# Patient Record
Sex: Male | Born: 1935 | Race: White | Hispanic: No | Smoking: Former smoker
Health system: Southern US, Community
[De-identification: ages and names within clinical notes are randomized; demographics above are authoritative.]

## PROBLEM LIST (undated history)

## (undated) ENCOUNTER — Emergency Department (HOSPITAL_BASED_OUTPATIENT_CLINIC_OR_DEPARTMENT_OTHER): Admission: EM | Payer: PPO | Source: Home / Self Care

## (undated) DIAGNOSIS — Z87898 Personal history of other specified conditions: Secondary | ICD-10-CM

## (undated) DIAGNOSIS — M1711 Unilateral primary osteoarthritis, right knee: Secondary | ICD-10-CM

## (undated) DIAGNOSIS — M1712 Unilateral primary osteoarthritis, left knee: Secondary | ICD-10-CM

## (undated) DIAGNOSIS — I10 Essential (primary) hypertension: Secondary | ICD-10-CM

## (undated) DIAGNOSIS — E785 Hyperlipidemia, unspecified: Secondary | ICD-10-CM

## (undated) HISTORY — DX: Hyperlipidemia, unspecified: E78.5

## (undated) HISTORY — DX: Unilateral primary osteoarthritis, left knee: M17.12

## (undated) HISTORY — DX: Personal history of other specified conditions: Z87.898

## (undated) HISTORY — DX: Unilateral primary osteoarthritis, right knee: M17.11

## (undated) HISTORY — PX: LUMBAR DISC SURGERY: SHX700

## (undated) HISTORY — PX: APPENDECTOMY: SHX54

## (undated) HISTORY — PX: SKIN BIOPSY: SHX1

## (undated) HISTORY — DX: Essential (primary) hypertension: I10

---

## 2006-01-05 ENCOUNTER — Ambulatory Visit: Payer: Self-pay | Admitting: Family Medicine

## 2006-01-05 LAB — CONVERTED CEMR LAB
AST: 18 units/L (ref 0–37)
CO2: 32 meq/L (ref 19–32)
Chloride: 104 meq/L (ref 96–112)
Creatinine, Ser: 0.7 mg/dL (ref 0.4–1.5)
HDL: 43.6 mg/dL (ref >39.0)
Potassium: 4.2 meq/L (ref 3.5–5.1)
Sodium: 142 meq/L (ref 135–145)
Triglyceride fasting, serum: 115 mg/dL (ref 0–149)

## 2006-02-25 ENCOUNTER — Ambulatory Visit: Payer: Self-pay | Admitting: Gastroenterology

## 2006-04-08 ENCOUNTER — Ambulatory Visit: Payer: Self-pay | Admitting: Family Medicine

## 2006-05-06 ENCOUNTER — Encounter (INDEPENDENT_AMBULATORY_CARE_PROVIDER_SITE_OTHER): Payer: Self-pay | Admitting: Specialist

## 2006-05-06 ENCOUNTER — Ambulatory Visit: Payer: Self-pay | Admitting: Gastroenterology

## 2006-07-07 DIAGNOSIS — F528 Other sexual dysfunction not due to a substance or known physiological condition: Secondary | ICD-10-CM | POA: Insufficient documentation

## 2006-07-07 DIAGNOSIS — E785 Hyperlipidemia, unspecified: Secondary | ICD-10-CM

## 2006-07-07 DIAGNOSIS — I10 Essential (primary) hypertension: Secondary | ICD-10-CM | POA: Insufficient documentation

## 2006-07-07 DIAGNOSIS — G4700 Insomnia, unspecified: Secondary | ICD-10-CM | POA: Insufficient documentation

## 2006-07-07 DIAGNOSIS — M549 Dorsalgia, unspecified: Secondary | ICD-10-CM | POA: Insufficient documentation

## 2006-07-08 ENCOUNTER — Encounter: Payer: Self-pay | Admitting: Family Medicine

## 2006-07-08 ENCOUNTER — Encounter (INDEPENDENT_AMBULATORY_CARE_PROVIDER_SITE_OTHER): Payer: Self-pay | Admitting: Family Medicine

## 2006-07-08 ENCOUNTER — Ambulatory Visit: Payer: Self-pay | Admitting: Family Medicine

## 2006-07-19 ENCOUNTER — Telehealth (INDEPENDENT_AMBULATORY_CARE_PROVIDER_SITE_OTHER): Payer: Self-pay | Admitting: *Deleted

## 2006-07-25 ENCOUNTER — Telehealth (INDEPENDENT_AMBULATORY_CARE_PROVIDER_SITE_OTHER): Payer: Self-pay | Admitting: *Deleted

## 2006-07-26 ENCOUNTER — Telehealth (INDEPENDENT_AMBULATORY_CARE_PROVIDER_SITE_OTHER): Payer: Self-pay | Admitting: *Deleted

## 2006-07-26 LAB — CONVERTED CEMR LAB
AST: 16 units/L (ref 0–37)
BUN: 14 mg/dL (ref 6–23)
CO2: 33 meq/L — ABNORMAL HIGH (ref 19–32)
GFR calc Af Amer: 171 mL/min
HDL: 48.8 mg/dL (ref >39.0)
Potassium: 4.2 meq/L (ref 3.5–5.1)
Sodium: 139 meq/L (ref 135–145)
Total CHOL/HDL Ratio: 3.7
Triglycerides: 114 mg/dL (ref 0–149)
VLDL: 23 mg/dL (ref 0–40)

## 2006-08-01 ENCOUNTER — Telehealth (INDEPENDENT_AMBULATORY_CARE_PROVIDER_SITE_OTHER): Payer: Self-pay | Admitting: *Deleted

## 2006-10-07 ENCOUNTER — Ambulatory Visit: Payer: Self-pay | Admitting: Family Medicine

## 2006-10-10 ENCOUNTER — Encounter (INDEPENDENT_AMBULATORY_CARE_PROVIDER_SITE_OTHER): Payer: Self-pay | Admitting: *Deleted

## 2006-10-10 LAB — CONVERTED CEMR LAB
CO2: 34 meq/L — ABNORMAL HIGH (ref 19–32)
Calcium: 9.4 mg/dL (ref 8.4–10.5)
Chloride: 106 meq/L (ref 96–112)
Creatinine, Ser: 0.7 mg/dL (ref 0.4–1.5)
GFR calc non Af Amer: 118 mL/min
Glucose, Bld: 92 mg/dL (ref 70–99)
Sodium: 144 meq/L (ref 135–145)

## 2007-01-13 ENCOUNTER — Ambulatory Visit: Payer: Self-pay | Admitting: Family Medicine

## 2007-01-17 LAB — CONVERTED CEMR LAB
ALT: 15 units/L (ref 0–53)
AST: 15 units/L (ref 0–37)
BUN: 13 mg/dL (ref 6–23)
Calcium: 9.4 mg/dL (ref 8.4–10.5)
GFR calc Af Amer: 171 mL/min
GFR calc non Af Amer: 141 mL/min
LDL Cholesterol: 95 mg/dL (ref 0–99)
Potassium: 3.8 meq/L (ref 3.5–5.1)
Total CHOL/HDL Ratio: 3.4
Triglycerides: 75 mg/dL (ref 0–149)
VLDL: 15 mg/dL (ref 0–40)

## 2007-01-18 ENCOUNTER — Encounter (INDEPENDENT_AMBULATORY_CARE_PROVIDER_SITE_OTHER): Payer: Self-pay | Admitting: *Deleted

## 2007-01-18 ENCOUNTER — Telehealth (INDEPENDENT_AMBULATORY_CARE_PROVIDER_SITE_OTHER): Payer: Self-pay | Admitting: *Deleted

## 2007-03-06 ENCOUNTER — Telehealth (INDEPENDENT_AMBULATORY_CARE_PROVIDER_SITE_OTHER): Payer: Self-pay | Admitting: *Deleted

## 2007-03-10 ENCOUNTER — Ambulatory Visit: Payer: Self-pay | Admitting: Family Medicine

## 2007-04-10 ENCOUNTER — Telehealth (INDEPENDENT_AMBULATORY_CARE_PROVIDER_SITE_OTHER): Payer: Self-pay | Admitting: *Deleted

## 2007-07-05 ENCOUNTER — Telehealth (INDEPENDENT_AMBULATORY_CARE_PROVIDER_SITE_OTHER): Payer: Self-pay | Admitting: *Deleted

## 2007-07-26 ENCOUNTER — Encounter: Payer: Self-pay | Admitting: Family Medicine

## 2007-07-26 ENCOUNTER — Ambulatory Visit: Payer: Self-pay | Admitting: Internal Medicine

## 2007-07-27 ENCOUNTER — Encounter (INDEPENDENT_AMBULATORY_CARE_PROVIDER_SITE_OTHER): Payer: Self-pay | Admitting: *Deleted

## 2007-07-28 ENCOUNTER — Telehealth (INDEPENDENT_AMBULATORY_CARE_PROVIDER_SITE_OTHER): Payer: Self-pay | Admitting: *Deleted

## 2007-07-28 LAB — CONVERTED CEMR LAB
BUN: 11 mg/dL (ref 6–23)
Chloride: 102 meq/L (ref 96–112)
Eosinophils Relative: 4.1 % (ref 0.0–5.0)
GFR calc Af Amer: 143 mL/min
GFR calc non Af Amer: 118 mL/min
Glucose, Bld: 95 mg/dL (ref 70–99)
HCT: 44.5 % (ref 39.0–52.0)
Monocytes Absolute: 0.9 10*3/uL (ref 0.1–1.0)
Monocytes Relative: 9.7 % (ref 3.0–12.0)
Neutrophils Relative %: 75.7 % (ref 43.0–77.0)
Platelets: 181 10*3/uL (ref 150–400)
Potassium: 3.8 meq/L (ref 3.5–5.1)
RDW: 14.4 % (ref 11.5–14.6)
Sodium: 140 meq/L (ref 135–145)
TSH: 1.05 microintl units/mL (ref 0.35–5.50)
WBC: 9 10*3/uL (ref 4.5–10.5)

## 2007-07-31 ENCOUNTER — Encounter (INDEPENDENT_AMBULATORY_CARE_PROVIDER_SITE_OTHER): Payer: Self-pay | Admitting: *Deleted

## 2007-08-07 ENCOUNTER — Telehealth (INDEPENDENT_AMBULATORY_CARE_PROVIDER_SITE_OTHER): Payer: Self-pay | Admitting: *Deleted

## 2007-08-10 ENCOUNTER — Telehealth: Payer: Self-pay | Admitting: Internal Medicine

## 2007-08-14 ENCOUNTER — Telehealth: Payer: Self-pay | Admitting: Internal Medicine

## 2007-10-19 ENCOUNTER — Ambulatory Visit: Payer: Self-pay | Admitting: Internal Medicine

## 2007-11-10 ENCOUNTER — Encounter (INDEPENDENT_AMBULATORY_CARE_PROVIDER_SITE_OTHER): Payer: Self-pay | Admitting: *Deleted

## 2007-11-29 ENCOUNTER — Telehealth (INDEPENDENT_AMBULATORY_CARE_PROVIDER_SITE_OTHER): Payer: Self-pay | Admitting: *Deleted

## 2007-12-13 ENCOUNTER — Ambulatory Visit: Payer: Self-pay | Admitting: Internal Medicine

## 2008-01-01 ENCOUNTER — Telehealth (INDEPENDENT_AMBULATORY_CARE_PROVIDER_SITE_OTHER): Payer: Self-pay | Admitting: *Deleted

## 2008-04-04 ENCOUNTER — Telehealth (INDEPENDENT_AMBULATORY_CARE_PROVIDER_SITE_OTHER): Payer: Self-pay | Admitting: *Deleted

## 2008-04-15 ENCOUNTER — Ambulatory Visit: Payer: Self-pay | Admitting: Internal Medicine

## 2008-04-15 DIAGNOSIS — Z860100 Personal history of colon polyps, unspecified: Secondary | ICD-10-CM | POA: Insufficient documentation

## 2008-04-15 LAB — CONVERTED CEMR LAB
Cholesterol, target level: 200 mg/dL
LDL Goal: 130 mg/dL

## 2008-04-19 ENCOUNTER — Ambulatory Visit: Payer: Self-pay | Admitting: Internal Medicine

## 2008-04-29 LAB — CONVERTED CEMR LAB
ALT: 14 units/L (ref 0–53)
AST: 17 units/L (ref 0–37)
Albumin: 3.8 g/dL (ref 3.5–5.2)
Basophils Relative: 0.9 % (ref 0.0–3.0)
Cholesterol: 155 mg/dL (ref 0–200)
Eosinophils Relative: 5.5 % — ABNORMAL HIGH (ref 0.0–5.0)
HCT: 44.1 % (ref 39.0–52.0)
Hemoglobin: 15.1 g/dL (ref 13.0–17.0)
Hgb A1c MFr Bld: 6 % (ref 4.6–6.0)
LDL Cholesterol: 95 mg/dL (ref 0–99)
Lymphocytes Relative: 17.1 % (ref 12.0–46.0)
Monocytes Absolute: 0.7 10*3/uL (ref 0.1–1.0)
Monocytes Relative: 9.7 % (ref 3.0–12.0)
Neutro Abs: 5.2 10*3/uL (ref 1.4–7.7)
PSA: 2.37 ng/mL (ref 0.10–4.00)
RBC: 5.44 M/uL (ref 4.22–5.81)
Total CHOL/HDL Ratio: 3.1
Total Protein: 6.5 g/dL (ref 6.0–8.3)

## 2008-04-30 ENCOUNTER — Encounter (INDEPENDENT_AMBULATORY_CARE_PROVIDER_SITE_OTHER): Payer: Self-pay | Admitting: *Deleted

## 2008-07-08 ENCOUNTER — Telehealth (INDEPENDENT_AMBULATORY_CARE_PROVIDER_SITE_OTHER): Payer: Self-pay | Admitting: *Deleted

## 2008-09-27 ENCOUNTER — Telehealth (INDEPENDENT_AMBULATORY_CARE_PROVIDER_SITE_OTHER): Payer: Self-pay | Admitting: *Deleted

## 2008-10-24 ENCOUNTER — Ambulatory Visit: Payer: Self-pay | Admitting: Internal Medicine

## 2008-10-29 ENCOUNTER — Encounter (INDEPENDENT_AMBULATORY_CARE_PROVIDER_SITE_OTHER): Payer: Self-pay | Admitting: *Deleted

## 2008-12-26 ENCOUNTER — Telehealth (INDEPENDENT_AMBULATORY_CARE_PROVIDER_SITE_OTHER): Payer: Self-pay | Admitting: *Deleted

## 2009-02-05 ENCOUNTER — Ambulatory Visit: Payer: Self-pay | Admitting: Internal Medicine

## 2009-02-18 ENCOUNTER — Encounter: Payer: Self-pay | Admitting: Internal Medicine

## 2009-04-15 ENCOUNTER — Encounter (INDEPENDENT_AMBULATORY_CARE_PROVIDER_SITE_OTHER): Payer: Self-pay | Admitting: *Deleted

## 2009-04-28 ENCOUNTER — Ambulatory Visit: Payer: Self-pay | Admitting: Internal Medicine

## 2009-04-28 ENCOUNTER — Telehealth (INDEPENDENT_AMBULATORY_CARE_PROVIDER_SITE_OTHER): Payer: Self-pay | Admitting: *Deleted

## 2009-04-28 LAB — CONVERTED CEMR LAB
Cholesterol: 150 mg/dL (ref 0–200)
HDL: 57.7 mg/dL (ref >39.00)
LDL Cholesterol: 76 mg/dL (ref 0–99)
Triglycerides: 84 mg/dL (ref 0.0–149.0)
VLDL: 16.8 mg/dL (ref 0.0–40.0)

## 2009-05-07 ENCOUNTER — Ambulatory Visit: Payer: Self-pay | Admitting: Internal Medicine

## 2009-05-07 ENCOUNTER — Encounter (INDEPENDENT_AMBULATORY_CARE_PROVIDER_SITE_OTHER): Payer: Self-pay | Admitting: *Deleted

## 2009-05-07 DIAGNOSIS — L570 Actinic keratosis: Secondary | ICD-10-CM | POA: Insufficient documentation

## 2009-05-08 LAB — CONVERTED CEMR LAB
AST: 16 units/L (ref 0–37)
BUN: 11 mg/dL (ref 6–23)

## 2009-05-19 ENCOUNTER — Encounter: Payer: Self-pay | Admitting: Internal Medicine

## 2009-06-30 ENCOUNTER — Telehealth (INDEPENDENT_AMBULATORY_CARE_PROVIDER_SITE_OTHER): Payer: Self-pay | Admitting: *Deleted

## 2009-08-26 ENCOUNTER — Telehealth: Payer: Self-pay | Admitting: Internal Medicine

## 2009-09-02 ENCOUNTER — Telehealth: Payer: Self-pay | Admitting: Internal Medicine

## 2009-12-02 ENCOUNTER — Telehealth: Payer: Self-pay | Admitting: Gastroenterology

## 2009-12-12 ENCOUNTER — Encounter: Payer: Self-pay | Admitting: Internal Medicine

## 2009-12-23 ENCOUNTER — Telehealth (INDEPENDENT_AMBULATORY_CARE_PROVIDER_SITE_OTHER): Payer: Self-pay | Admitting: *Deleted

## 2010-03-11 ENCOUNTER — Telehealth (INDEPENDENT_AMBULATORY_CARE_PROVIDER_SITE_OTHER): Payer: Self-pay | Admitting: *Deleted

## 2010-03-14 ENCOUNTER — Encounter: Payer: Self-pay | Admitting: Family Medicine

## 2010-03-26 NOTE — Medication Information (Signed)
Summary: Nonadherence with Statin/[employer]  Nonadherence with Statin/[employer]   Imported By: Lanelle Bal 01/06/2010 11:53:36  _____________________________________________________________________  External Attachment:    Type:   Image     Comment:   External Document

## 2010-03-26 NOTE — Letter (Signed)
Summary: Colonoscopy Letter  [facility]  [address] [place], [place] [address]   Phone: [phone]  Fax: [phone]      April 15, 2009 MRN: [id number]   Wayne Nash [address] [place], [place]  [address]   Dear Mr. Makar,   According to your medical record, it is time for you to schedule a Colonoscopy. The American Cancer Society recommends this procedure as a method to detect early colon cancer. Patients with a family history of colon cancer, or a personal history of colon polyps or inflammatory bowel disease are at increased risk.  This letter has beeen generated based on the recommendations made at the time of your procedure. If you feel that in your particular situation this may no longer apply, please contact our office.  Please call our office at [phone] to schedule this appointment or to update your records at your earliest convenience.  Thank you for cooperating with [place] to provide you with the very best care possible.   Sincerely,  Barbette Hair. Arlyce Dice, M.D.  [facility] Gastroenterology Division [phone]

## 2010-03-26 NOTE — Consult Note (Signed)
Summary: [facility]  [facility]   Imported By: Lanelle Bal 03/06/2009 08:57:45  _____________________________________________________________________  External Attachment:    Type:   Image     Comment:   External Document

## 2010-03-26 NOTE — Progress Notes (Signed)
Summary: lipitor refill--pt is almost out  Phone Note Refill Request Call back at [phone] Message from:  Patient on March 11, 2010 4:25 PM  Refills Requested: Medication #1:  LIPITOR 20 MG TABS Take one tablet daily. [facility]--he is almost out  Next Appointment Scheduled: none Initial call taken by: Jerolyn Shin,  March 11, 2010 4:26 PM    New/Updated Medications: LIPITOR 20 MG TABS (ATORVASTATIN CALCIUM) Take one tablet daily **LABS DUE 04/2010** Prescriptions: LIPITOR 20 MG TABS (ATORVASTATIN CALCIUM) Take one tablet daily **LABS DUE 04/2010**  #90 x 0   Entered by:   Shonna Chock CMA   Authorized by:   Marga Melnick MD   Signed by:   Shonna Chock CMA on 03/11/2010   Method used:   Faxed to ...       [facility] (mail-[facility])             , [place]         Ph: [phone]       Fax: [phone]   RxID:   [id number]

## 2010-03-26 NOTE — Consult Note (Signed)
Summary: Duard Larsen MD Dermatology  Duard Larsen MD Dermatology   Imported By: Lanelle Bal 07/09/2009 10:04:44  _____________________________________________________________________  External Attachment:    Type:   Image     Comment:   External Document

## 2010-03-26 NOTE — Letter (Signed)
Summary: Primary Care Consult Scheduled Letter  [facility] [place]  [address] [place], [place] [address]   Phone: [phone]  Fax: [phone]      05/07/2009 MRN: [id number]  DIONTRE HARPS [address] [place], [place]  [address]    Dear Mr. Sundra Aland,    We have scheduled an appointment for you.  At the recommendation of Dr. Marga Melnick, we have scheduled you a consult with Dr. June Leap of [facility] on 05-19-2009 at 11:30am.  Their address is [address], [place] [address]. The office phone number is [phone].  If this appointment day and time is not convenient for you, please feel free to call the office of the doctor you are being referred to at the number listed above and reschedule the appointment.    It is important for you to keep your scheduled appointments. We are here to make sure you are given good patient care.   Thank you,    Renee, Patient Care Coordinator [place] [facility]

## 2010-03-26 NOTE — Progress Notes (Signed)
Summary: Refill Request  Phone Note Refill Request Message from:  Patient on Jun 30, 2009 9:13 AM  Refills Requested: Medication #1:  BENAZEPRIL-HYDROCHLOROTHIAZIDE 10-12.5 MG TABS Take one tablet daily   Dosage confirmed as above?Dosage Confirmed   Supply Requested: 3 months [facility]  Next Appointment Scheduled: none Initial call taken by: Harold Barban,  Jun 30, 2009 9:13 AM    Prescriptions: BENAZEPRIL-HYDROCHLOROTHIAZIDE 10-12.5 MG TABS (BENAZEPRIL-HYDROCHLOROTHIAZIDE) Take one tablet daily  #90 x 1   Entered by:   Shonna Chock   Authorized by:   Marga Melnick MD   Signed by:   Shonna Chock on 06/30/2009   Method used:   Faxed to ...       [facility] (mail-order)             ,          Ph: [phone]       Fax: [phone]   RxID:   [id number]

## 2010-03-26 NOTE — Progress Notes (Signed)
Summary: needs 2 meds from local pharmacy  Phone Note Refill Request Message from:  Patient on December 23, 2009 3:30 PM  Refills Requested: Medication #1:  BENAZEPRIL-HYDROCHLOROTHIAZIDE 10-12.5 MG TABS Take one tablet daily  Medication #2:  LIPITOR 20 MG TABS Take one tablet daily. patient says he needs 15 pills of each until medication from [facility] is received--[facility]  Initial call taken by: Jerolyn Shin,  December 23, 2009 5:05 PM  Follow-up for Phone Call         I sent rx's in for #30, pharmacy usually charges the same amount for 15 or 30 day supply Follow-up by: Shonna Chock CMA,  December 24, 2009 11:20 AM    Prescriptions: LIPITOR 20 MG TABS (ATORVASTATIN CALCIUM) Take one tablet daily.  #30 x 0   Entered by:   Shonna Chock CMA   Authorized by:   Marga Melnick MD   Signed by:   Shonna Chock CMA on 12/24/2009   Method used:   Electronically to        [facility] [phone]* (retail)       [address]       [place], [place]  [address]       Ph: [phone]       Fax: [phone]   RxID:   [id number] BENAZEPRIL-HYDROCHLOROTHIAZIDE 10-12.5 MG TABS (BENAZEPRIL-HYDROCHLOROTHIAZIDE) Take one tablet daily  #30 x 0   Entered by:   Shonna Chock CMA   Authorized by:   Marga Melnick MD   Signed by:   Shonna Chock CMA on 12/24/2009   Method used:   Electronically to        [facility] [phone]* (retail)       [address]       [place], [place]  [address]       Ph: [phone]       Fax: [phone]   RxID:   [id number]

## 2010-03-26 NOTE — Progress Notes (Signed)
Summary: Refill--Lipitor  Phone Note Refill Request Message from:  Patient on September 02, 2009 9:16 AM  Refills Requested: Medication #1:  LIPITOR 20 MG TABS Take one tablet daily. I spoke with patient and he notes that he is dangerously low of the above med and only has one refill of his BP med. Patient aware we will fax prescription.  Initial call taken by: Lucious Groves,  September 02, 2009 9:16 AM    Prescriptions: LIPITOR 20 MG TABS (ATORVASTATIN CALCIUM) Take one tablet daily.  #90 x 1   Entered by:   Lucious Groves   Authorized by:   Marga Melnick MD   Signed by:   Lucious Groves on 09/02/2009   Method used:   Faxed to ...       [facility] (mail-order)             , [place]         Ph: [phone]       Fax: [phone]   RxID:   [id number]

## 2010-03-26 NOTE — Letter (Signed)
Summary: Results Follow-up Letter  [place] [facility]  [address] [place], [place] [address]   Phone: [phone]  Fax: [phone]    07/31/2007        Kimberlee Nearing. Kempker [address] [place], [place]  [address]  Dear Mr. Exley,   The following are the results of your recent test(s):  Test     Result     Pap Smear    Normal_______  Not Normal_____       Comments: _________________________________________________________ Cholesterol LDL(Bad cholesterol):          Your goal is less than:         HDL (Good cholesterol):        Your goal is more than: _________________________________________________________ Other Tests:   _________________________________________________________  Please call for an appointment Or __Please see attached._______________________________________________________ _________________________________________________________ _________________________________________________________  Sincerely,  Ardyth Man [place] [facility]

## 2010-03-26 NOTE — Progress Notes (Signed)
Summary: Schedule Colonoscopy   Phone Note Outgoing Call Call back at Home Phone [phone]   Call placed by: Harlow Mares CMA Duncan Dull),  December 02, 2009 4:04 PM Call placed to: Patient Summary of Call: No Answer, patient is due for a recall colonoscopy. Initial call taken by: Harlow Mares CMA Duncan Dull),  December 02, 2009 4:04 PM  Follow-up for Phone Call        No Answer, we will mail the patient a letter to remind  him he is due for a colonoscopy. Follow-up by: Harlow Mares CMA Duncan Dull),  December 05, 2009 4:20 PM

## 2010-03-26 NOTE — Progress Notes (Signed)
Summary: insect bite?   Phone Note Refill Request Message from:  [facility] on August 26, 2009 1:36 PM  Received refill request from pharmacy for Prednisone. Pt states he was prescribed this a couple of years ago when he had an insect bite. States that something bit him through the night a couple of days ago and had a red, swollen place on his arm.  He states he took the last 2 Prednisone pills and the swelling/redness went away.  Advised pt. he needed to be seen and pt. states he doesn't need to be seen now that the swelling is gone, just wants refill to have on hand.  Please advise.  Nicki Guadalajara Fergerson CMA Duncan Dull)  August 26, 2009 1:39 PM    Follow-up for Phone Call        Prednisone shiould be taken as infrequently as possible due to adverse effects & risks ( mainly compromised immune status , bone thinning & diabetes) . If he has new symptoms he feels warrant steroids , he should call. I'll assess it based on symptoms & signs. I'm just trying to practice good medicine for him Follow-up by: Marga Melnick MD,  August 26, 2009 5:16 PM  Additional Follow-up for Phone Call Additional follow up Details #1::        Home number has no answering machine. 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 I asked him to give the pt a message to call drs office. Additional Follow-up by: Josph Macho RMA,  August 27, 2009 11:14 AM    Additional Follow-up for Phone Call Additional follow up Details #2::    Home number not working, called work number and he is not in the office at this time asked to give him a message. Army Fossa CMA  August 28, 2009 3:07 PM   Pt did not return messages that were left for him. Army Fossa CMA  September 01, 2009 4:38 PM   Additional Follow-up for Phone Call Additional follow up Details #3:: Details for Additional Follow-up Action Taken: Topical steroids such as Cort Aid two times a day as needed would be most appropriate therapy;  oral steroids would have excessive risks compared to any short term  benefit Additional Follow-up by: Marga Melnick MD,  September 02, 2009 8:20 AM     Attempted to reach patient at home number, no answer/no machine. Called work number, left message with "Thayer Ohm" to have the patient call the office. Lucious Groves  September 02, 2009 8:37 AM   Patient notified [phone]) and notes that he has taken Benadryl. [place] 2 or 3 times. Patient was requesting to have Prednisone to keep at home. Patient advised to get the above cream and keep it at home for these episodes and to call back if it does not help. Patient expressed understanding. Lucious Groves  September 02, 2009 9:16 AM

## 2010-03-26 NOTE — Progress Notes (Signed)
Summary: refill  Phone Note Refill Request Message from:  Fax from Pharmacy on April 28, 2009 9:03 AM  Refills Requested: Medication #1:  HYDROCODONE-ACETAMINOPHEN 5-500 MG TABS 1 by mouth  by mouth 6 hr as needed [facility] [phone]   Method Requested: Fax to Local Pharmacy Next Appointment Scheduled: no appt Initial call taken by: Barb Merino,  April 28, 2009 9:04 AM    Prescriptions: HYDROCODONE-ACETAMINOPHEN 5-500 MG TABS (HYDROCODONE-ACETAMINOPHEN) 1 by mouth  by mouth 6 hr as needed  #50 x 0   Entered by:   Shonna Chock   Authorized by:   Marga Melnick MD   Signed by:   Shonna Chock on 04/29/2009   Method used:   Printed then faxed to ...       [facility] [phone]* (retail)       [address]       [place], [place]  [address]       Ph: [phone]       Fax: [phone]   RxID:   [id number]

## 2010-03-26 NOTE — Progress Notes (Signed)
°  Phone Note Outgoing Call   Call placed by: Ardyth Man,  Jul 19, 2006 4:25 PM Request: Send information Summary of Call: Archie Endo and [facility] for copy of PSA and also faxed request to their lab.  Follow-up for Phone Call        Phone call completed Follow-up by: Ardyth Man,  July 25, 2006 3:14 PM

## 2010-03-26 NOTE — Assessment & Plan Note (Signed)
Summary: bp check//lch   Nurse Visit   Vital Signs:  Patient profile:   77 year old male Weight:      238.4 pounds Pulse rate:   72 / minute Resp:     16 per minute BP sitting:   126 / 60  (left arm) Cuff size:   large  Vitals Entered By: Shonna Chock (May 07, 2009 8:36 AM)  CC:  1.) Discuss B/P  2.) Follow-up on labs (copy given)  3.) Refill Zolpidem and Viagra, Hypertension Management, and Lipid Management.  History of Present Illness: Labs reviewed & risks discussed. Lipids & A1c are @ goal & stable over 12 months. Walking @work ; no specific diet.He is averages 6 hydrocodone / week, Zolpidem 4X/week, & 5 Doxepin / month. Rarely will he take Chlozoxazone. Risk of taking these closely together discussed.  Hypertension History:      He denies headache, chest pain, palpitations, dyspnea with exertion, orthopnea, PND, peripheral edema, visual symptoms, neurologic problems, syncope, and side effects from treatment.  He notes no problems with any antihypertensive medication side effects.  Not checking BP regularly ,but ? high @ drugstore.        Positive major cardiovascular risk factors include male age 60 years old or older, hyperlipidemia, and hypertension.  Negative major cardiovascular risk factors include no history of diabetes, negative family history for ischemic heart disease, and non-tobacco-user status.        Further assessment for target organ damage reveals no history of ASHD, stroke/TIA, or peripheral vascular disease.    Lipid Management History:      Positive NCEP/ATP III risk factors include male age 19 years old or older and hypertension.  Negative NCEP/ATP III risk factors include non-diabetic, no family history for ischemic heart disease, non-tobacco-user status, no ASHD (atherosclerotic heart disease), no prior stroke/TIA, no peripheral vascular disease, and no history of aortic aneurysm.      Past History:  Past Medical History: Hypertension ED; DDD LS  spine Colonic polyps, PMH  of Hyperlipidemia  Past Surgical History: Lumbar laminectomy , Dr Simonne Come Appendectomy Colon polypectomy, PMH of. He has received recall letter in 03/2009   Review of Systems General:  Complains of sleep disorder; denies fatigue and weight loss. Eyes:  Denies blurring, double vision, and vision loss-both eyes. ENT:  Denies difficulty swallowing and hoarseness. CV:  Denies leg cramps with exertion and lightheadness. Resp:  Denies cough, excessive snoring, hypersomnolence, morning headaches, shortness of breath, and sputum productive. GI:  Denies abdominal pain, bloody stools, dark tarry stools, and indigestion. MS:  Complains of low back pain. Derm:  Denies changes in nail beds, dryness, hair loss, and poor wound healing. Neuro:  Denies numbness and tingling. Psych:  Denies anxiety and depression. Endo:  Denies cold intolerance, excessive hunger, excessive thirst, excessive urination, and heat intolerance. Heme:  Denies abnormal bruising and bleeding.   Physical Exam  General:  well-nourished;appears younger than age; alert,appropriate and cooperative throughout examination Neck:  No deformities, masses, or tenderness noted. Lungs:  Normal respiratory effort, chest expands symmetrically. Lungs are clear to auscultation, no crackles or wheezes. Heart:  Normal rate and regular rhythm. S1 and S2 normal without gallop, murmur, click, rub or other extra sounds. Abdomen:  Bowel sounds positive,abdomen soft and non-tender without masses, organomegaly or hernias noted. Pulses:  R and L carotid,radial,dorsalis pedis and posterior tibial pulses are full and equal bilaterally Extremities:  No clubbing, cyanosis, edema, or deformity noted with normal full range of motion of all joints.  Neg SLR Neurologic:  alert & oriented X3, strength normal in all extremities, and DTRs symmetrical and normal except 0+ @ R knee.   Skin:  Solar keratoses of forearms Cervical  Nodes:  No lymphadenopathy noted Axillary Nodes:  No palpable lymphadenopathy Psych:  memory intact for recent and remote, normally interactive, and good eye contact.      Impression & Recommendations:  Problem # 1:  HYPERLIPIDEMIA (ICD-272.4)  Lipids @ goal His updated medication list for this problem includes:    Lipitor 20 Mg Tabs (Atorvastatin calcium) .Marland Kitchen... Take one tablet daily.  Orders: TLB-ALT (SGPT) (84460-ALT) TLB-AST (SGOT) (84450-SGOT)  Problem # 2:  HYPERTENSION (ICD-401.9)  controlled His updated medication list for this problem includes:    Benazepril-hydrochlorothiazide 10-12.5 Mg Tabs (Benazepril-hydrochlorothiazide) .Marland Kitchen... Take one tablet daily  Orders: Venipuncture ([address]) TLB-Creatinine, Blood (82565-CREA) TLB-Potassium (K+) (84132-K) TLB-BUN (Urea Nitrogen) (84520-BUN)  Problem # 3:  COLONIC POLYPS, HX OF (ICD-V12.72) F/U discussed ; requested to schedule with GI  Problem # 4:  SOLAR KERATOSIS (ICD-702.0)  Orders: Dermatology Referral (Derma)  Complete Medication List: 1)  Zolpidem Tartrate 10 Mg Tabs (Zolpidem tartrate) .Marland Kitchen.. 1 tablet by mouth at bedtime 2)  Doxepin Hcl 25 Mg Caps (Doxepin hcl) 3)  Benazepril-hydrochlorothiazide 10-12.5 Mg Tabs (Benazepril-hydrochlorothiazide) .... Take one tablet daily 4)  Viagra 100 Mg Tabs (Sildenafil citrate) 5)  Chlorzoxazone 500 Mg Tabs (Chlorzoxazone) 6)  Hydrocodone-acetaminophen 5-500 Mg Tabs (Hydrocodone-acetaminophen) .Marland Kitchen.. 1 by mouth  by mouth 6 hr as needed 7)  Lipitor 20 Mg Tabs (Atorvastatin calcium) .... Take one tablet daily. 8)  Cyclobenzaprine Hcl 5 Mg Tabs (Cyclobenzaprine hcl) .Marland Kitchen.. 1 two times a day as needed & 1-2 at bedtime as needed  Hypertension Assessment/Plan:      The patient's hypertensive risk group is category B: At least one risk factor (excluding diabetes) with no target organ damage.  His calculated 10 year risk of coronary heart disease is 9 %.  Today's blood pressure is 126/60.     Lipid Assessment/Plan:      Based on NCEP/ATP III, the patient's risk factor category is "2 or more risk factors and a calculated 10 year CAD risk of < 20%".  The patient's lipid goals are as follows: Total cholesterol goal is 200; LDL cholesterol goal is 130; HDL cholesterol goal is 40; Triglyceride goal is 150.  His LDL cholesterol goal has been met.     Patient Instructions: 1)  Schedule a colonoscopy  to help detect colon cancer as discussed.   Family History: Father:  DUD Mother: pancreatic cancer Siblings: none; no FH CAD  Social History: Occupation: Parts Delivery Divorced Former Smoker Alcohol use-no   CC: 1.) Discuss B/P  2.) Follow-up on labs (copy given)  3.) Refill Zolpidem and Viagra, Hypertension Management, Lipid Management  Does patient need assistance? Functional Status Self care Comments REVIEWED MED LIST, PATIENT AGREED DOSE AND INSTRUCTION CORRECT    Preventive Screening-Counseling & Management  Alcohol-Tobacco     Smoking Status: quit  Allergies: 1)  ! Pcn 2)  ! * Bee Stings  Orders Added: 1)  Venipuncture [36415] 2)  TLB-ALT (SGPT) [84460-ALT] 3)  TLB-AST (SGOT) [84450-SGOT] 4)  TLB-Creatinine, Blood [82565-CREA] 5)  TLB-Potassium (K+) [84132-K] 6)  TLB-BUN (Urea Nitrogen) [84520-BUN] 7)  Est. Patient Level IV [[address]] 8)  Dermatology Referral [Derma] Prescriptions: VIAGRA 100 MG TABS (SILDENAFIL CITRATE)   #6 x 5   Entered and Authorized by:   Marga Melnick MD   Signed by:   Chrissie Noa  Matthew Pais MD on 05/07/2009   Method used:   Print then Give to Patient   RxID:   [id number] ZOLPIDEM TARTRATE 10 MG TABS (ZOLPIDEM TARTRATE) 1 tablet by mouth at bedtime  #30 x 3   Entered and Authorized by:   Marga Melnick MD   Signed by:   Marga Melnick MD on 05/07/2009   Method used:   Print then Give to Patient   RxID:   [id number]

## 2010-04-21 ENCOUNTER — Telehealth (INDEPENDENT_AMBULATORY_CARE_PROVIDER_SITE_OTHER): Payer: Self-pay | Admitting: *Deleted

## 2010-04-30 ENCOUNTER — Encounter (INDEPENDENT_AMBULATORY_CARE_PROVIDER_SITE_OTHER): Payer: 59 | Admitting: Internal Medicine

## 2010-04-30 ENCOUNTER — Other Ambulatory Visit: Payer: Self-pay | Admitting: Internal Medicine

## 2010-04-30 ENCOUNTER — Encounter: Payer: Self-pay | Admitting: Internal Medicine

## 2010-04-30 DIAGNOSIS — I10 Essential (primary) hypertension: Secondary | ICD-10-CM

## 2010-04-30 DIAGNOSIS — E785 Hyperlipidemia, unspecified: Secondary | ICD-10-CM

## 2010-04-30 DIAGNOSIS — Z860100 Personal history of colon polyps, unspecified: Secondary | ICD-10-CM

## 2010-04-30 DIAGNOSIS — Z0000 Encounter for general adult medical examination without abnormal findings: Secondary | ICD-10-CM

## 2010-04-30 DIAGNOSIS — Z23 Encounter for immunization: Secondary | ICD-10-CM

## 2010-04-30 LAB — BASIC METABOLIC PANEL
BUN: 15 mg/dL (ref 6–23)
Creatinine, Ser: 0.5 mg/dL (ref 0.4–1.5)
GFR: 164.8 mL/min (ref >60.00)
Glucose, Bld: 99 mg/dL (ref 70–99)
Potassium: 4.3 mEq/L (ref 3.5–5.1)

## 2010-04-30 LAB — CBC WITH DIFFERENTIAL/PLATELET
Basophils Absolute: 0.1 10*3/uL (ref 0.0–0.1)
HCT: 44.4 % (ref 39.0–52.0)
Lymphs Abs: 1.3 10*3/uL (ref 0.7–4.0)
MCV: 81.9 fl (ref 78.0–100.0)
Monocytes Absolute: 0.6 10*3/uL (ref 0.1–1.0)
Monocytes Relative: 7.5 % (ref 3.0–12.0)
Platelets: 180 10*3/uL (ref 150.0–400.0)
RDW: 15.3 % — ABNORMAL HIGH (ref 11.5–14.6)

## 2010-04-30 LAB — CONVERTED CEMR LAB
Bilirubin Urine: NEGATIVE
Blood in Urine, dipstick: NEGATIVE
Glucose, Urine, Semiquant: NEGATIVE
Protein, U semiquant: NEGATIVE
Urobilinogen, UA: 0.2
pH: 6

## 2010-04-30 LAB — HEPATIC FUNCTION PANEL: Total Bilirubin: 0.6 mg/dL (ref 0.3–1.2)

## 2010-04-30 LAB — LIPID PANEL
Cholesterol: 162 mg/dL (ref 0–200)
Triglycerides: 69 mg/dL (ref 0.0–149.0)
VLDL: 13.8 mg/dL (ref 0.0–40.0)

## 2010-04-30 LAB — TSH: TSH: 1.11 u[IU]/mL (ref 0.35–5.50)

## 2010-04-30 NOTE — Progress Notes (Signed)
Summary: Refill   Phone Note Refill Request Message from:  Fax from Pharmacy on April 21, 2010 9:29 AM  Refills Requested: Medication #1:  HYDROCODONE-ACETAMINOPHEN 5-500 MG TABS 1 by mouth  by mouth 6 hr as needed Ivar Bury - fax [phone]  Initial call taken by: Okey Regal [place],  April 21, 2010 9:30 AM  Follow-up for Phone Call        Last OV 02/05/2009, Dr.Hopper please advise Follow-up by: Shonna Chock CMA,  April 21, 2010 2:09 PM  Additional Follow-up for Phone Call Additional follow up Details #1::        this a strong  narcotic , not  maintenance medication. Unfortunately because of his age & lack of recent evaluation , I can not fill this w/o evaluation Additional Follow-up by: Marga Melnick MD,  April 23, 2010 12:52 PM    Additional Follow-up for Phone Call Additional follow up Details #2::    I called patient (home number) no answer. No voicemail to leave message informing patient appointment due.I called number listed as work number, patient not avaliable.  I called the pharmacy and left message on voicemail informing them patient is to contact the office for an appointment before med to be filled. Follow-up by: Shonna Chock CMA,  April 23, 2010 2:35 PM

## 2010-05-01 DIAGNOSIS — K8020 Calculus of gallbladder without cholecystitis without obstruction: Secondary | ICD-10-CM | POA: Insufficient documentation
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
## 2010-05-25 ENCOUNTER — Telehealth: Payer: Self-pay | Admitting: Internal Medicine

## 2010-05-25 NOTE — Telephone Encounter (Signed)
Patient needs refill lipitor - 90 day supply - [facility]

## 2010-05-26 MED ORDER — ATORVASTATIN CALCIUM 20 MG PO TABS: 20.0000 mg | ORAL_TABLET | Freq: Every day | ORAL | Status: DC

## 2010-07-04 ENCOUNTER — Other Ambulatory Visit: Payer: Self-pay | Admitting: Internal Medicine

## 2010-09-07 ENCOUNTER — Other Ambulatory Visit: Payer: Self-pay | Admitting: Internal Medicine

## 2010-12-07 ENCOUNTER — Telehealth: Payer: Self-pay

## 2010-12-07 NOTE — Telephone Encounter (Signed)
Patient left message on voicemail questioning if he had a pneumonia vaccine  I reviewed EMR and paper chart, no vaccine on file. I called and left message on voicemail for patient informing him he can get vaccine here at our office (nurse visit) or we can send rx to a local pharmacy

## 2010-12-25 ENCOUNTER — Ambulatory Visit: Payer: 59

## 2010-12-25 ENCOUNTER — Telehealth: Payer: Self-pay | Admitting: Internal Medicine

## 2010-12-25 NOTE — Telephone Encounter (Signed)
Scheduled for Pneumonia per pt. Request. [does not need Shingles]

## 2010-12-25 NOTE — Telephone Encounter (Signed)
Patient wants to know if he had pneumonia vac & shingles vac

## 2010-12-28 ENCOUNTER — Ambulatory Visit (INDEPENDENT_AMBULATORY_CARE_PROVIDER_SITE_OTHER): Payer: Medicare Other | Admitting: *Deleted

## 2010-12-28 VITALS — Temp 97.5°F

## 2010-12-28 DIAGNOSIS — Z23 Encounter for immunization: Secondary | ICD-10-CM

## 2010-12-28 DIAGNOSIS — Z0000 Encounter for general adult medical examination without abnormal findings: Secondary | ICD-10-CM

## 2011-04-22 ENCOUNTER — Other Ambulatory Visit: Payer: Self-pay | Admitting: Internal Medicine

## 2011-04-23 NOTE — Telephone Encounter (Signed)
Prescription sent to pharmacy. Due for appt 04/2011

## 2011-05-24 ENCOUNTER — Encounter: Payer: Self-pay | Admitting: Gastroenterology

## 2011-05-27 ENCOUNTER — Telehealth: Payer: Self-pay | Admitting: Internal Medicine

## 2011-05-27 NOTE — Telephone Encounter (Signed)
Patient has never been written for this before, [facility] a [facility]: Hydrocodon-acetaminophen 5-500 Take 1-tablet by mouth every 6-hours as needed  Last time we filled a prescription for this patient it stated DUE APPT., there is no upcoming appointment for this patient.

## 2011-05-27 NOTE — Telephone Encounter (Signed)
Last seen 04/30/2010, Dr.Hopper please advise

## 2011-05-27 NOTE — Telephone Encounter (Signed)
I'm sorry but this will require an office visit to fill this narcotic medication

## 2011-05-28 NOTE — Telephone Encounter (Signed)
Discussed with pt

## 2011-05-31 ENCOUNTER — Telehealth: Payer: Self-pay | Admitting: Internal Medicine

## 2011-05-31 NOTE — Telephone Encounter (Signed)
I have not written this prescription; for what is he taking the hydrocodone. Which physician originally prescribed it and with what frequency does he take it.

## 2011-05-31 NOTE — Telephone Encounter (Signed)
Left message to call office

## 2011-05-31 NOTE — Telephone Encounter (Signed)
Patient made a cpe appt for 08-02-11. He would like to know if Dr. Alwyn Ren would refill his hydrocodone medication until then?

## 2011-05-31 NOTE — Telephone Encounter (Signed)
Last OV 04/30/2010, please advise if ok to fill Hydrocodone

## 2011-06-01 MED ORDER — HYDROCODONE-ACETAMINOPHEN 5-500 MG PO TABS: 1.0000 | ORAL_TABLET | Freq: Four times a day (QID) | ORAL | Status: DC | PRN

## 2011-06-01 NOTE — Telephone Encounter (Signed)
RX given to patient, patient walked in

## 2011-06-01 NOTE — Telephone Encounter (Signed)
Pt states that Dr hopper filled med for him and it is for his on going back pain.

## 2011-06-01 NOTE — Telephone Encounter (Signed)
I just needed an indication for the medication in the chart for medical legal reasons. Diagnosis: low back pain. #30 one every 6-8 hours as needed ,no refill.

## 2011-07-13 ENCOUNTER — Encounter: Payer: Self-pay | Admitting: Family Medicine

## 2011-07-13 ENCOUNTER — Ambulatory Visit (INDEPENDENT_AMBULATORY_CARE_PROVIDER_SITE_OTHER): Payer: 59 | Admitting: Family Medicine

## 2011-07-13 VITALS — BP 146/84 | HR 88 | Temp 98.2°F | Wt 238.0 lb

## 2011-07-13 DIAGNOSIS — L0291 Cutaneous abscess, unspecified: Secondary | ICD-10-CM | POA: Diagnosis not present

## 2011-07-13 DIAGNOSIS — L0390 Cellulitis, unspecified: Secondary | ICD-10-CM | POA: Diagnosis not present

## 2011-07-13 MED ORDER — CLINDAMYCIN HCL 300 MG PO CAPS: 300.0000 mg | ORAL_CAPSULE | Freq: Three times a day (TID) | ORAL | Status: AC

## 2011-07-13 NOTE — Patient Instructions (Signed)

## 2011-07-13 NOTE — Assessment & Plan Note (Signed)
Clindamycin for 10 day F/u 2 day  If reddness worsens go to ER

## 2011-07-13 NOTE — Progress Notes (Signed)
  Subjective:    Patient ID: Wayne Nash, male    DOB: 11-Aug-1933, 78 y.o.   MRN: [id number]  HPI Pt walked in just before 5 today and c/o just noticing his elbow and arm were red and hot.  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 No fevers, no pain.   Review of Systems As above    Objective:   Physical Exam  Constitutional: He is oriented to person, place, and time. He appears well-developed and well-nourished.  Neurological: He is alert and oriented to person, place, and time.  Skin:       L elbow-- swollen and red , nontender,  Hot to touch with reddness spreading to axilla down to close to wrist---- mareked with marker  Psychiatric: He has a normal mood and affect. His behavior is normal. Judgment and thought content normal.          Assessment & Plan:

## 2011-07-15 ENCOUNTER — Encounter: Payer: Self-pay | Admitting: Internal Medicine

## 2011-07-15 ENCOUNTER — Ambulatory Visit (INDEPENDENT_AMBULATORY_CARE_PROVIDER_SITE_OTHER): Payer: 59 | Admitting: Internal Medicine

## 2011-07-15 VITALS — BP 128/60 | HR 72 | Temp 97.9°F | Wt 234.8 lb

## 2011-07-15 DIAGNOSIS — L03114 Cellulitis of left upper limb: Secondary | ICD-10-CM

## 2011-07-15 DIAGNOSIS — IMO0002 Reserved for concepts with insufficient information to code with codable children: Secondary | ICD-10-CM

## 2011-07-15 NOTE — Progress Notes (Signed)
  Subjective:    Patient ID: Wayne Nash, male    DOB: 01-03-1934, 78 y.o.   MRN: [id number]  HPI 07/13/11 office visit reviewed; he was seen for cellulitis of the left elbow and forearm and placed on clindamycin 10 mg every 6 hours.  He denies any fever, chills, sweats, or progression of the cellulitis. He feels the degree of redness in the left forearm is dramatically better.    Review of Systems  He is not having abdominal pain or bowel changes such as loose stools or frank diarrhea.     Objective:   Physical Exam  He is in no acute distress.  There is no lymphadenopathy about the neck, axilla, or epitrochlear areas.  There is faint erythema of 32 X 9 cm of  the right medial/central elbow/ forearm  area without tenderness to compression or palpation. The delineated area of prior cellulitis has decreased by 50%. There is no increased temperature to touch.  There is marked drying keratotic change of the forearms  There is some induration without frank fluctuance at the left olecranon area. Again this is not tender to palpation or percussion  Radial pulses are normal.          Assessment & Plan:  #1 cellulitis, dramatic improvement with the clindamycin  #2 no bowel changes.  Plan: See recommendations

## 2011-07-15 NOTE — Patient Instructions (Signed)
Use an  ink  pen to outline the area of redness after showering each day. This should shrink in size each day. Report warning signs as discussed: Increasing pain, swelling, or red streaks emanating toward the body Use Eucerin or Aveeno Daily  Moisturizing Lotion  twice a day  for the dry skin. Bathe with moisturizing liquid soap , not bar soap.  Please take the probiotic , Align, every day until the bowels are normal. This will replace the normal bacteria which  are necessary for formation of normal stool and processing of food.

## 2011-07-26 ENCOUNTER — Other Ambulatory Visit: Payer: Self-pay | Admitting: Internal Medicine

## 2011-07-28 ENCOUNTER — Telehealth: Payer: Self-pay | Admitting: Internal Medicine

## 2011-07-28 NOTE — Telephone Encounter (Signed)
Discuss with patient  

## 2011-07-28 NOTE — Telephone Encounter (Signed)
Pt came into the office this morning with an empty bottle of clindamycin. He was seen 07/15/11 and states his cellulitis is 80% better, but is thinking he needs more antibiotics to clear it. I advised pt someone would call him and let him know if it needs to be refilled, or if he needs another OV. [facility] uses [facility]. Call back # [phone]

## 2011-07-28 NOTE — Telephone Encounter (Signed)
Dr.Hopper please advise 

## 2011-07-28 NOTE — Telephone Encounter (Signed)
This antibiotic has a very high risk of causing severe colitis. If it has not RESOLVED after 10 days of Clindamycin ; he should be seen. If there is residual abscess formation it may need to  be drained.

## 2011-07-28 NOTE — Telephone Encounter (Signed)
Refill request from pharmacy: Clindamycin hcl 300mg  capsule. Take 1 capsule by mouth 3 times daily. Qty 30.

## 2011-08-02 ENCOUNTER — Encounter: Payer: Self-pay | Admitting: Internal Medicine

## 2011-08-02 ENCOUNTER — Ambulatory Visit (INDEPENDENT_AMBULATORY_CARE_PROVIDER_SITE_OTHER): Payer: 59 | Admitting: Internal Medicine

## 2011-08-02 VITALS — BP 150/58 | HR 63 | Temp 97.9°F | Wt 236.0 lb

## 2011-08-02 DIAGNOSIS — Z860100 Personal history of colon polyps, unspecified: Secondary | ICD-10-CM

## 2011-08-02 DIAGNOSIS — N429 Disorder of prostate, unspecified: Secondary | ICD-10-CM

## 2011-08-02 DIAGNOSIS — E785 Hyperlipidemia, unspecified: Secondary | ICD-10-CM | POA: Diagnosis not present

## 2011-08-02 DIAGNOSIS — Z0000 Encounter for general adult medical examination without abnormal findings: Secondary | ICD-10-CM

## 2011-08-02 DIAGNOSIS — I10 Essential (primary) hypertension: Secondary | ICD-10-CM

## 2011-08-02 LAB — HEPATIC FUNCTION PANEL
Alkaline Phosphatase: 109 U/L (ref 39–117)
Bilirubin, Direct: 0.1 mg/dL (ref 0.0–0.3)
Total Protein: 6.2 g/dL (ref 6.0–8.3)

## 2011-08-02 LAB — CBC WITH DIFFERENTIAL/PLATELET
Basophils Relative: 0.8 % (ref 0.0–3.0)
Eosinophils Absolute: 0.2 10*3/uL (ref 0.0–0.7)
Eosinophils Relative: 2.1 % (ref 0.0–5.0)
Lymphocytes Relative: 16.9 % (ref 12.0–46.0)
MCHC: 32.5 g/dL (ref 30.0–36.0)
Neutrophils Relative %: 70.8 % (ref 43.0–77.0)
RBC: 5.68 Mil/uL (ref 4.22–5.81)
WBC: 7.9 10*3/uL (ref 4.5–10.5)

## 2011-08-02 LAB — BASIC METABOLIC PANEL
Calcium: 9.3 mg/dL (ref 8.4–10.5)
Creatinine, Ser: 0.5 mg/dL (ref 0.4–1.5)

## 2011-08-02 LAB — TSH: TSH: 1.86 u[IU]/mL (ref 0.35–5.50)

## 2011-08-02 LAB — LIPID PANEL
HDL: 47.9 mg/dL (ref >39.00)
Total CHOL/HDL Ratio: 3

## 2011-08-02 MED ORDER — HYDROCODONE-ACETAMINOPHEN 5-500 MG PO TABS: 1.0000 | ORAL_TABLET | Freq: Four times a day (QID) | ORAL | Status: DC | PRN

## 2011-08-02 NOTE — Progress Notes (Signed)
Subjective:    Patient ID: Wayne Nash, male    DOB: October 26, 1933, 78 y.o.   MRN: [id number]  HPI Medicare Wellness Visit:  The following psychosocial & medical history were reviewed as required by Medicare.   Social history: caffeine: 2-3 cups/ day , alcohol:  Quit 1985 ,  tobacco use : quit 1996  & exercise : walking @ work.   Home & personal  safety / fall risk:no issues, activities of daily living: no limitations , seatbelt use : yes , and smoke alarm employment : yes .  Power of Attorney/Living Will status : in place  Vision ( as recorded per Nurse) & Hearing  evaluation : Ophth exam annually(pending); see exam. Orientation :oriented X 3 , memory & recall :normal, spelling  testing: good,and mood & affect : normal . Depression / anxiety: denied Travel history : never , immunization status :up to date , transfusion history:  no, and preventive health surveillance ( colonoscopies, BMD , etc as per protocol/ [facility]): colonoscopy up to date, Dental care:  dentures . Chart reviewed &  Updated. Active issues reviewed & addressed.      Review of Systems HYPERTENSION: Disease Monitoring: Blood pressure range-not checked @ home  Chest pain, palpitations-no       Dyspnea- no Medications: Compliance- yes  Lightheadedness,Syncope-no    Edema- no  HYPERLIPIDEMIA: Disease Monitoring: See symptoms for Hypertension Medications: Compliance- no  Abd pain, bowel changes- no  Muscle aches- with lifting  Polyuria/phagia/dipsia- no      Visual problems- no        Objective:   Physical Exam Gen.: Healthy and well-nourished in appearance. Alert, appropriate and cooperative throughout exam. Head: Normocephalic without obvious abnormalities Eyes: No corneal or conjunctival inflammation noted. Pupils equal round reactive to light and accommodation. Fundal exam is benign without hemorrhages, exudate, papilledema. Extraocular motion intact. Vision grossly normal with lenses. Ears: External   ear exam reveals no significant lesions or deformities. Canals clear .TMs normal. Hearing is grossly decreased bilaterally to whisper @ 6 ft. Nose: External nasal exam reveals no deformity or inflammation. Nasal mucosa are pink and moist. No lesions or exudates noted. Septum  Slightly dislocated Mouth: Oral mucosa and oropharynx reveal no lesions or exudates. Dentures. Neck: No deformities, masses, or tenderness noted. Range of motion normal Thyroid substernal. Lungs: Normal respiratory effort; chest expands symmetrically. Lungs are clear to auscultation without rales, wheezes, or increased work of breathing. Heart: Normal rate and rhythm. Normal S1 and S2. No gallop, click, or rub. S4 w/o   murmur. Abdomen: Bowel sounds normal; abdomen soft and nontender. No masses, organomegaly or hernias noted. Genitalia/ DRE: Some testicular atrophy bilaterally. 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                                                 Musculoskeletal/extremities: No deformity or scoliosis noted of  the thoracic or lumbar spine. No clubbing, cyanosis, edema, or deformity noted. Range of motion  normal .Tone & strength  normal.Joints normal. Nail health  good. Vascular: Carotid, radial artery, dorsalis pedis and  posterior tibial pulses are full and equal. No bruits present. Neurologic: Alert and oriented x3. Deep tendon reflexes  Normal except R knee.          Skin: Intact without suspicious lesions or rashes.Very large lipoma R posterior thorax  Lymph: No cervical, axillary, or inguinal lymphadenopathy present. Psych: Mood and affect are normal. Normally interactive                                                                                         Assessment & Plan:  #1 Medicare Wellness Exam; criteria met ; data entered #2 Problem List reviewed ; Assessment/ Recommendations made Plan: see Orders

## 2011-08-02 NOTE — Patient Instructions (Signed)
Preventive Health Care: Exercise at least 30-45 minutes a day,  3-4 days a week.  Eat a low-fat diet with lots of fruits and vegetables, up to 7-9 servings per day. Avoid obesity; your goal is waist measurement < 40 inches.Consume less than 40 grams of sugar per day from foods & drinks with High Fructose Corn Sugar as # 1,2,3 or # 4 on label. Eye Doctor - have an eye exam @ least annually.  Please consider Audiology evaluation for hearing.   Blood Pressure Goal  Ideally is an AVERAGE < 135/85. This AVERAGE should be calculated from @ least 5-7 BP readings taken @ different times of day on different days of week. You should not respond to isolated BP readings , but rather the AVERAGE for that week                                                 Please try to go on My Chart within the next 24 hours to allow me to release the results directly to you.

## 2011-10-04 ENCOUNTER — Other Ambulatory Visit: Payer: Self-pay | Admitting: Family Medicine

## 2011-10-04 MED ORDER — HYDROCODONE-ACETAMINOPHEN 5-500 MG PO TABS: 1.0000 | ORAL_TABLET | Freq: Four times a day (QID) | ORAL | Status: DC | PRN

## 2011-10-04 NOTE — Telephone Encounter (Signed)
Refill Hydrocodone-Acetaminophen (Tab) VICODIN 5-500 MG Take 1 tablet by mouth every 6 (six) hours as needed.-last fill 6.10.13 Last ov 6.10.13 v70

## 2011-10-04 NOTE — Telephone Encounter (Signed)
RX called in .

## 2011-10-04 NOTE — Telephone Encounter (Signed)
OK #30 

## 2011-10-04 NOTE — Telephone Encounter (Signed)
Last seen and filled 08/02/11 # 30.Marland KitchenMarland KitchenMarland KitchenPlease advise if refill is appropriate. Thanks KP

## 2011-10-26 ENCOUNTER — Other Ambulatory Visit: Payer: Self-pay | Admitting: Internal Medicine

## 2011-10-26 MED ORDER — HYDROCODONE-ACETAMINOPHEN 5-500 MG PO TABS: 1.0000 | ORAL_TABLET | Freq: Four times a day (QID) | ORAL | Status: DC | PRN

## 2011-10-26 NOTE — Telephone Encounter (Signed)
Refill Hydrocodone-Acetaminophen (Tab) 5-500 MG Take 1 tablet by mouth every 6 (six) hours as needed---last wrt. 6.10.13  NOTE LAST REFILL called in 8.12.13 #30  Last ov 6.10.13 V70

## 2011-10-26 NOTE — Telephone Encounter (Signed)
Filled on 10/04/11 for #30, last OV 08/02/11, please advise

## 2011-10-26 NOTE — Telephone Encounter (Signed)
RX called in .

## 2011-10-26 NOTE — Telephone Encounter (Signed)
OK X1 

## 2011-10-30 DIAGNOSIS — Z23 Encounter for immunization: Secondary | ICD-10-CM | POA: Diagnosis not present

## 2011-11-16 ENCOUNTER — Other Ambulatory Visit: Payer: Self-pay | Admitting: Internal Medicine

## 2011-12-14 ENCOUNTER — Other Ambulatory Visit: Payer: Self-pay | Admitting: Internal Medicine

## 2012-01-13 ENCOUNTER — Other Ambulatory Visit: Payer: Self-pay | Admitting: Internal Medicine

## 2012-01-13 NOTE — Telephone Encounter (Signed)
Rx sent.    MW 

## 2012-02-15 ENCOUNTER — Other Ambulatory Visit: Payer: Self-pay | Admitting: Internal Medicine

## 2012-02-15 NOTE — Telephone Encounter (Signed)
Spoke with patient, patient will stop by today to sign Controlled Substance Contract and pick up rx

## 2012-04-09 ENCOUNTER — Other Ambulatory Visit: Payer: Self-pay | Admitting: Internal Medicine

## 2012-07-01 ENCOUNTER — Other Ambulatory Visit: Payer: Self-pay | Admitting: Internal Medicine

## 2012-07-03 NOTE — Telephone Encounter (Signed)
Schedule CPX 

## 2012-07-03 NOTE — Telephone Encounter (Signed)
5-325 please with same instructions

## 2012-07-03 NOTE — Telephone Encounter (Signed)
Hopp please advise , 5-500 is no longer an available dose for Hydrocodone

## 2012-07-04 ENCOUNTER — Encounter: Payer: Self-pay | Admitting: General Practice

## 2012-07-04 MED ORDER — HYDROCODONE-ACETAMINOPHEN 5-325 MG PO TABS
1.0000 | ORAL_TABLET | Freq: Four times a day (QID) | ORAL | Status: DC | PRN
Start: 2012-07-04 — End: 2012-09-04

## 2012-07-04 NOTE — Telephone Encounter (Signed)
Letter mailed

## 2012-07-04 NOTE — Telephone Encounter (Signed)
Med filled.  

## 2012-07-04 NOTE — Telephone Encounter (Signed)
Called pt to schedule, No answer on home phone.

## 2012-09-04 ENCOUNTER — Other Ambulatory Visit: Payer: Self-pay | Admitting: Internal Medicine

## 2012-09-05 NOTE — Telephone Encounter (Signed)
Last OV 08/02/11, pending OV 09/11/2012, last filled 07/04/12 #30  Hopp please advise

## 2012-09-05 NOTE — Telephone Encounter (Signed)
OK X1 

## 2012-09-11 ENCOUNTER — Ambulatory Visit (INDEPENDENT_AMBULATORY_CARE_PROVIDER_SITE_OTHER): Payer: Medicare Other | Admitting: Internal Medicine

## 2012-09-11 ENCOUNTER — Encounter: Payer: Self-pay | Admitting: Internal Medicine

## 2012-09-11 VITALS — BP 132/78 | HR 74 | Temp 97.5°F | Resp 12 | Ht 68.5 in | Wt 240.0 lb

## 2012-09-11 DIAGNOSIS — I10 Essential (primary) hypertension: Secondary | ICD-10-CM

## 2012-09-11 DIAGNOSIS — E785 Hyperlipidemia, unspecified: Secondary | ICD-10-CM

## 2012-09-11 DIAGNOSIS — Z86010 Personal history of colonic polyps: Secondary | ICD-10-CM

## 2012-09-11 DIAGNOSIS — Z0000 Encounter for general adult medical examination without abnormal findings: Secondary | ICD-10-CM

## 2012-09-11 LAB — CBC WITH DIFFERENTIAL/PLATELET
Basophils Absolute: 0.1 10*3/uL (ref 0.0–0.1)
Basophils Relative: 0.6 % (ref 0.0–3.0)
Eosinophils Absolute: 0.2 10*3/uL (ref 0.0–0.7)
HCT: 43.2 % (ref 39.0–52.0)
Hemoglobin: 14.4 g/dL (ref 13.0–17.0)
Lymphs Abs: 1.4 10*3/uL (ref 0.7–4.0)
MCHC: 33.2 g/dL (ref 30.0–36.0)
MCV: 82.5 fl (ref 78.0–100.0)
Neutro Abs: 6.1 10*3/uL (ref 1.4–7.7)
RBC: 5.24 Mil/uL (ref 4.22–5.81)
RDW: 15.5 % — ABNORMAL HIGH (ref 11.5–14.6)

## 2012-09-11 LAB — BASIC METABOLIC PANEL
CO2: 33 mEq/L — ABNORMAL HIGH (ref 19–32)
Chloride: 102 mEq/L (ref 96–112)
Glucose, Bld: 96 mg/dL (ref 70–99)
Potassium: 3.4 mEq/L — ABNORMAL LOW (ref 3.5–5.1)
Sodium: 141 mEq/L (ref 135–145)

## 2012-09-11 LAB — TSH: TSH: 1.73 u[IU]/mL (ref 0.35–5.50)

## 2012-09-11 LAB — HEPATIC FUNCTION PANEL
AST: 15 U/L (ref 0–37)
Albumin: 4 g/dL (ref 3.5–5.2)
Total Protein: 6.9 g/dL (ref 6.0–8.3)

## 2012-09-11 LAB — LIPID PANEL
Cholesterol: 149 mg/dL (ref 0–200)
HDL: 54 mg/dL (ref >39.00)
VLDL: 15.4 mg/dL (ref 0.0–40.0)

## 2012-09-11 NOTE — Patient Instructions (Addendum)
Preventive Health Care: Exercise at least 30-45 minutes a day,  3-4 days a week.  Eat a low-fat diet with lots of fruits and vegetables, up to 7-9 servings per day. This would eliminate the need for vitamin supplements. Consume less than 40 grams of sugar (preferably ZERO) per day from foods & drinks with High Fructose Corn Sugar as #1,2,3 or # 4 on label. Share results with all non [facility] medical staff seen

## 2012-09-11 NOTE — Progress Notes (Signed)
Subjective:    Patient ID: Wayne Nash, male    DOB: 1933/10/08, 79 y.o.   MRN: [id number]  HPI Medicare Wellness Visit:  Psychosocial & medical history were reviewed as required by Medicare (abuse,antisocial behavioral risks,firearm risk).  Social history: caffeine:3-4 cups coffee/ day  , alcohol: none  ,  tobacco use: quit 1996 Exercise :  See below Home & personal  safety / fall risk:no Limitations of activities of daily living: no Seatbelt  and smoke alarm use:yes Power of Attorney/Living Will status : in place Ophthalmology exam status : current Hearing evaluation status:not current Orientation :oriented X 3 Memory & recall : good Spelling  testing:good Active depression / anxiety: denied Foreign travel history : never Immunization status for Shingles /Flu/ PNA/ tetanus : current Transfusion history:  no Preventive health surveillance status of colonoscopy as per protocol/ SOC: Dental care:  dentures Chart reviewed &  Updated. Active issues reviewed & addressed.      Review of Systems  He is on no diet; he is not exercising regularly. Specifically he denies chest pain, palpitations, dyspnea, or claudication. Family history is negative for premature coronary disease. No advanced cholesterol testing to date.      Objective:   Physical Exam Gen.:  well-nourished in appearance. Alert, appropriate and cooperative throughout exam.Appears younger than stated age  Head: Normocephalic without obvious abnormalities; no alopecia  Eyes: No corneal or conjunctival inflammation noted.  Extraocular motion intact. Vision grossly normal with lenses Ears: External  ear exam reveals no significant lesions or deformities. Wax bilaterally. Hearing is grossly decreased bilaterally. Nose: External nasal exam reveals no deformity or inflammation. Nasal mucosa are pink and moist. No lesions or exudates noted. Septum to R Mouth: Oral mucosa and oropharynx reveal no lesions or exudates.  Dentures in good repair. Neck: No deformities, masses, or tenderness noted. Range of motion & Thyroid normal Lungs: Normal respiratory effort; chest expands symmetrically. Lungs are clear to auscultation without rales, wheezes, or increased work of breathing.BS decreased Heart: Normal rate and rhythm. Normal S1 and S2. No gallop, click, or rub. S4 w/o murmur. Abdomen: Bowel sounds normal; abdomen soft and nontender. No masses, organomegaly or hernias noted. Genitalia: Deferred due to age                               Musculoskeletal/extremities: No deformity or scoliosis noted of  the thoracic or lumbar spine.  No clubbing, cyanosis, edema, or significant extremity  deformity noted. Range of motion decreased in legs .Tone & strength  Normal. Joints normal. Nail health good. Able to lie down & sit up w/o help. Negative SLR bilaterally Vascular: Carotid, radial artery, dorsalis pedis and  posterior tibial pulses are full and equal. No bruits present. Neurologic: Alert and oriented x3. Deep tendon reflex 0+ @ R knee.      Skin: Intact without suspicious lesions or rashes.Solar keratoses.Large lipoma R posterior thorax. Lymph: No cervical, axillary lymphadenopathy present. Psych: Mood and affect are normal. Normally interactive  Assessment & Plan:  #1 Medicare Wellness Exam; criteria met ; data entered #2 Problem List/Diagnoses reviewed Plan:  Assessments made/ Orders entered  

## 2012-09-13 DIAGNOSIS — Z79899 Other long term (current) drug therapy: Secondary | ICD-10-CM | POA: Diagnosis not present

## 2012-09-22 ENCOUNTER — Other Ambulatory Visit: Payer: Self-pay | Admitting: Internal Medicine

## 2012-09-29 ENCOUNTER — Encounter: Payer: Self-pay | Admitting: Internal Medicine

## 2012-10-11 DIAGNOSIS — Z23 Encounter for immunization: Secondary | ICD-10-CM | POA: Diagnosis not present

## 2012-12-11 ENCOUNTER — Telehealth: Payer: Self-pay | Admitting: *Deleted

## 2012-12-11 ENCOUNTER — Other Ambulatory Visit: Payer: Self-pay | Admitting: *Deleted

## 2012-12-11 MED ORDER — HYDROCODONE-ACETAMINOPHEN 5-325 MG PO TABS: ORAL_TABLET | ORAL | Status: DC

## 2012-12-11 NOTE — Telephone Encounter (Signed)
Pt called and stated that he would like a refill on his hydrocodone-acetaminophen 5-325mg .  Last filled on 09/03/2012 #30, 0 refills Last visit on 09/11/2012 UDS on 09/11/2012-low risk, contract signed  Please advise. SW, CMA

## 2012-12-11 NOTE — Telephone Encounter (Signed)
OK X1 

## 2012-12-11 NOTE — Telephone Encounter (Signed)
Hydrocodone refilled. Pt called to pick up and informed that a UDS is due

## 2012-12-13 DIAGNOSIS — Z79899 Other long term (current) drug therapy: Secondary | ICD-10-CM | POA: Diagnosis not present

## 2013-01-12 ENCOUNTER — Encounter: Payer: Self-pay | Admitting: Internal Medicine

## 2013-02-12 DIAGNOSIS — H0019 Chalazion unspecified eye, unspecified eyelid: Secondary | ICD-10-CM | POA: Diagnosis not present

## 2013-03-21 ENCOUNTER — Other Ambulatory Visit: Payer: Self-pay | Admitting: *Deleted

## 2013-03-21 ENCOUNTER — Telehealth: Payer: Self-pay | Admitting: Internal Medicine

## 2013-03-21 MED ORDER — HYDROCODONE-ACETAMINOPHEN 5-325 MG PO TABS: ORAL_TABLET | ORAL | Status: DC

## 2013-03-21 NOTE — Telephone Encounter (Signed)
Pt came in requesting refill on HYDROcodone-acetaminophen (NORCO/VICODIN) 5-325 MG per tablet   Last refill 12-11-12, #30 no refills  Last OV 09-11-12  Please advise.

## 2013-03-21 NOTE — Telephone Encounter (Signed)
Script printed, awaiting signature. JG//CMA 

## 2013-03-21 NOTE — Telephone Encounter (Signed)
OK X1 

## 2013-03-21 NOTE — Telephone Encounter (Signed)
Called and left message for patient to pick up. JG//CMA

## 2013-03-21 NOTE — Telephone Encounter (Signed)
Ok to refill? UDS up-to-date, low risk

## 2013-04-30 ENCOUNTER — Telehealth: Payer: Self-pay | Admitting: *Deleted

## 2013-04-30 NOTE — Telephone Encounter (Signed)
04/30/2013 Pt came by office, stated he called this morning to request refill on HYDROcodone-acetaminophen (NORCO/VICODIN) 5-325 MG per tablet.  States he has not heard from anyone and came here to pick it up.  Last filled 03/21/2013, #30, no refills.  Please advise patient if refill OK'd and where he needs to pick the prescription up.  Pt said he wants to pick it up at [facility].  bw

## 2013-05-01 NOTE — Telephone Encounter (Signed)
OK X1 

## 2013-05-01 NOTE — Telephone Encounter (Signed)
Requesting Hydrocodone 5-325mg  Take 1 tablet by mouth every 6 hours as needed. Last refill:03-21-13;#30,0 Last OV:09-11-12 UDS:12-13-12:Low risk Please advise.//AB/CMA

## 2013-05-02 MED ORDER — HYDROCODONE-ACETAMINOPHEN 5-325 MG PO TABS: ORAL_TABLET | ORAL | Status: DC

## 2013-05-02 NOTE — Addendum Note (Signed)
Addended by: Harl Bowie on: 05/02/2013 02:23 PM   Modules accepted: Orders

## 2013-05-02 NOTE — Telephone Encounter (Signed)
Rx printed and pt was informed it is ready for pick-up.  Will leave rx up front.//AB/CMA

## 2013-05-30 ENCOUNTER — Telehealth: Payer: Self-pay | Admitting: Internal Medicine

## 2013-05-30 ENCOUNTER — Ambulatory Visit (INDEPENDENT_AMBULATORY_CARE_PROVIDER_SITE_OTHER): Payer: Medicare Other | Admitting: Internal Medicine

## 2013-05-30 ENCOUNTER — Encounter: Payer: Self-pay | Admitting: Internal Medicine

## 2013-05-30 VITALS — BP 129/69 | HR 72 | Temp 97.8°F | Wt 239.0 lb

## 2013-05-30 DIAGNOSIS — R252 Cramp and spasm: Secondary | ICD-10-CM | POA: Diagnosis not present

## 2013-05-30 DIAGNOSIS — R21 Rash and other nonspecific skin eruption: Secondary | ICD-10-CM

## 2013-05-30 MED ORDER — TRIAMCINOLONE ACETONIDE 0.1 % EX LOTN: 1.0000 "application " | TOPICAL_LOTION | Freq: Three times a day (TID) | CUTANEOUS | Status: DC

## 2013-05-30 MED ORDER — ACYCLOVIR 800 MG PO TABS: 800.0000 mg | ORAL_TABLET | Freq: Every day | ORAL | Status: DC

## 2013-05-30 MED ORDER — CYCLOBENZAPRINE HCL 5 MG PO TABS: 5.0000 mg | ORAL_TABLET | Freq: Every evening | ORAL | Status: DC | PRN

## 2013-05-30 MED ORDER — VALACYCLOVIR HCL 1 G PO TABS: 1000.0000 mg | ORAL_TABLET | Freq: Three times a day (TID) | ORAL | Status: DC

## 2013-05-30 NOTE — Telephone Encounter (Signed)
I just sent a new prescription, acyclovir

## 2013-05-30 NOTE — Telephone Encounter (Signed)
Patient called and stated that Valtrex was too expensive. Please advise

## 2013-05-30 NOTE — Progress Notes (Signed)
Pre visit review using our clinic review tool, if applicable. No additional management support is needed unless otherwise documented below in the visit note. 

## 2013-05-30 NOTE — Progress Notes (Signed)
Subjective:    Patient ID: Wayne Nash, male    DOB: 1934/01/01, 80 y.o.   MRN: [id number]  DOS:  05/30/2013 Type of  Visit: Acute visit  Rash for 2 days at the right arm, armpit and  chest. Not painful, the area at the  arm is slightly itchy. Also long history of leg cramps, use to take muscle relaxants, request a refill.   ROS Denies fever or chills No neck pain. No exposure to any new lotions or palpitations. 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 Hyperlipidemia   . Hypertension     Past Surgical History  Procedure Laterality Date  . Colonoscopy  2012     negative  . Appendectomy    . Lumbar disc surgery      Dr Shellia Carwin    History   Social History  . Marital Status: Divorced    Spouse Name: N/A    Number of Children: N/A  . Years of Education: N/A   Occupational History  . Not on file.   Social History Main Topics  . Smoking status: Former Smoker    Quit date: 02/22/1994  . Smokeless tobacco: Not on file     Comment: smoked 1953-1996, up to 1.5 ppd  . Alcohol Use: No     Comment:  quit 1985  . Drug Use: No  . Sexual Activity: Not on file   Other Topics Concern  . Not on file   Social History Narrative  . No narrative on file        Medication List       This list is accurate as of: 05/30/13 11:59 PM.  Always use your most recent med list.               acyclovir 800 MG tablet  Commonly known as:  ZOVIRAX  Take 1 tablet (800 mg total) by mouth 5 (five) times daily.     aspirin 81 MG tablet  Take 81 mg by mouth daily.     atorvastatin 20 MG tablet  Commonly known as:  LIPITOR  1 by mouth daily     benazepril-hydrochlorthiazide 10-12.5 MG per tablet  Commonly known as:  LOTENSIN HCT  1 by mouth daily     cyclobenzaprine 5 MG tablet  Commonly known as:  FLEXERIL  Take 1 tablet (5 mg total) by mouth at bedtime as needed for muscle spasms (cramps).     HYDROcodone-acetaminophen 5-325 MG per tablet  Commonly known as:  NORCO/VICODIN  TAKE 1 TABLET BY MOUTH EVERY 6 HOURS AS NEEDED FOR PAIN     triamcinolone lotion 0.1 %  Commonly known as:  KENALOG  Apply 1 application topically 3 (three) times daily.           Objective:   Physical Exam  Skin:      BP 129/69  Pulse 72  Temp(Src) 97.8 F (36.6 C)  Wt 239 lb (108.41 kg)  SpO2 93%  General -- alert, well-developed, NAD.   Psych-- Cognition and judgment appear intact. Cooperative with normal attention span and concentration. No anxious or depressed appearing.       Assessment & Plan:   Rash, Unclear etiology Shingles? Affected dermatomes are at T6 and C6 consequently atypical Photodermatitis? Atypical, exposed and unexposed areas are affected. To be safe, I will prescribe Valtrex understanding this may not be shingles. (addendum -- change to  acyclovir d/t cost)  Also recommend  Kenalog lotion. Prompt follow up if not improving  Leg cramps, Previously a muscle relaxant help, recommend a low dose of Flexeril. Watch for excessive drowsiness the next morning.  Recommend to return to the office at his earliest convenience for a routine checkup

## 2013-05-30 NOTE — Patient Instructions (Signed)
Take the medications as prescribed.  If the rash gets worse --let us know   Take the muscle relaxant at bedtime only, watch for excessive somnolence.  Schedule a routine visit to get established at your earliest convenience

## 2013-05-31 NOTE — Telephone Encounter (Signed)
Pt.notified

## 2013-06-05 ENCOUNTER — Telehealth: Payer: Self-pay

## 2013-06-05 NOTE — Telephone Encounter (Signed)
Patient called to question if he could go back to work. He states that he is feeling better and that he has not had any blisters that have broken the skin. Advised patient that as long as he feels like going back it is fine for him to resume normal activities.

## 2013-06-07 ENCOUNTER — Other Ambulatory Visit: Payer: Self-pay | Admitting: Internal Medicine

## 2013-06-19 ENCOUNTER — Ambulatory Visit (INDEPENDENT_AMBULATORY_CARE_PROVIDER_SITE_OTHER): Payer: Medicare Other | Admitting: Internal Medicine

## 2013-06-19 ENCOUNTER — Encounter: Payer: Self-pay | Admitting: Internal Medicine

## 2013-06-19 VITALS — BP 115/71 | HR 78 | Temp 98.0°F | Wt 238.0 lb

## 2013-06-19 DIAGNOSIS — R21 Rash and other nonspecific skin eruption: Secondary | ICD-10-CM | POA: Insufficient documentation

## 2013-06-19 DIAGNOSIS — I10 Essential (primary) hypertension: Secondary | ICD-10-CM

## 2013-06-19 MED ORDER — PREDNISONE 10 MG PO TABS: 10.0000 mg | ORAL_TABLET | Freq: Every day | ORAL | Status: DC

## 2013-06-19 MED ORDER — TRIAMCINOLONE ACETONIDE 0.1 % EX LOTN: 1.0000 "application " | TOPICAL_LOTION | Freq: Three times a day (TID) | CUTANEOUS | Status: DC

## 2013-06-19 NOTE — Patient Instructions (Addendum)
For itching: claritin 10 mg OTC 1 tablet a day as needed Use the lotion twice a day Prednisone x 5 days      Next visit is for a physical exam in 2 months  , fasting Please make an appointment

## 2013-06-19 NOTE — Progress Notes (Signed)
Pre visit review using our clinic review tool, if applicable. No additional management support is needed unless otherwise documented below in the visit note. 

## 2013-06-19 NOTE — Assessment & Plan Note (Signed)
Well controlled, no change, RTC 2 months for a CPX

## 2013-06-19 NOTE — Assessment & Plan Note (Addendum)
Is clear now that the rash is not shingles, I suspect pityriasis rosea . Patient is quite concerned, will refer to dermatology. We'll control itching with Claritin, short term prednisone, kenalog

## 2013-06-19 NOTE — Progress Notes (Signed)
   Subjective:    Patient ID: Wayne Nash, male    DOB: 1933/05/18, 80 y.o.   MRN: [id number]  DOS:  06/19/2013 Type of  visit: followup from previous visit Since last time he was here, the rash has spread , it is  itchy mostly on the arms.     ROS In general feeling well. Denies fever, chills. No myalgias or joint aches  Past Medical History  Diagnosis Date  . Hyperlipidemia   . Hypertension     Past Surgical History  Procedure Laterality Date  . Colonoscopy  2012     negative  . Appendectomy    . Lumbar disc surgery      Dr Shellia Carwin    History   Social History  . Marital Status: Divorced    Spouse Name: N/A    Number of Children: 3  . Years of Education: N/A   Occupational History  . [employer] delivery    Social History Main Topics  . Smoking status: Former Smoker    Quit date: 02/22/1994  . Smokeless tobacco: Not on file     Comment: smoked 1953-1996, up to 1.5 ppd  . Alcohol Use: No     Comment:  quit 1985  . Drug Use: No  . Sexual Activity: Not on file   Other Topics Concern  . Not on file   Social History Narrative   7 Gc, 3 GGchildren   lives by himself         Medication List       This list is accurate as of: 06/19/13 12:52 PM.  Always use your most recent med list.               aspirin 81 MG tablet  Take 81 mg by mouth daily.     atorvastatin 20 MG tablet  Commonly known as:  LIPITOR  1 by mouth daily     benazepril-hydrochlorthiazide 10-12.5 MG per tablet  Commonly known as:  LOTENSIN HCT  1 by mouth daily     cyclobenzaprine 5 MG tablet  Commonly known as:  FLEXERIL  Take 1 tablet (5 mg total) by mouth at bedtime as needed for muscle spasms (cramps).     HYDROcodone-acetaminophen 5-325 MG per tablet  Commonly known as:  NORCO/VICODIN  TAKE 1 TABLET BY MOUTH EVERY 6 HOURS AS NEEDED FOR PAIN     predniSONE 10 MG tablet  Commonly known as:  DELTASONE  Take 1 tablet (10 mg total) by mouth daily. 2 tabs  a day x 5 days     triamcinolone lotion 0.1 %  Commonly known as:  KENALOG  Apply 1 application topically 3 (three) times daily.           Objective:   Physical Exam BP 115/71  Pulse 78  Temp(Src) 98 F (36.7 C)  Wt 238 lb (107.956 kg)  SpO2 94% General -- alert, well-developed, NAD.  Lungs -- normal respiratory effort, no intercostal retractions, no accessory muscle use, and decreased breath sounds.  Heart-- normal rate, regular rhythm, no murmur.  Skin-- Rash at B abdomen, chest, back, proximal LE: multiple < 1 cm , oval-round lesion, slt red, scally. Few in the back are  in a Xmas tree distribution Face hands not affected  Psych-- Cognition and judgment appear intact. Cooperative with normal attention span and concentration. No anxious or depressed appearing.       Assessment & Plan:

## 2013-06-20 ENCOUNTER — Telehealth: Payer: Self-pay | Admitting: Internal Medicine

## 2013-06-20 NOTE — Telephone Encounter (Signed)
Relevant patient education mailed to patient.  

## 2013-06-27 ENCOUNTER — Other Ambulatory Visit: Payer: Self-pay | Admitting: Internal Medicine

## 2013-06-28 ENCOUNTER — Other Ambulatory Visit: Payer: Self-pay | Admitting: Internal Medicine

## 2013-07-02 ENCOUNTER — Telehealth: Payer: Self-pay | Admitting: *Deleted

## 2013-07-02 DIAGNOSIS — L259 Unspecified contact dermatitis, unspecified cause: Secondary | ICD-10-CM | POA: Diagnosis not present

## 2013-07-02 NOTE — Telephone Encounter (Signed)
Waiting on approval from Dr. Rolla Flatten

## 2013-07-02 NOTE — Telephone Encounter (Signed)
Rx sent to the pharmacy by e-script.//AB/CMA 

## 2013-07-02 NOTE — Telephone Encounter (Signed)
Ok RF x 1

## 2013-07-02 NOTE — Telephone Encounter (Signed)
Rx approved and sent to the pharmacy by e-script.//AB/CMA 

## 2013-07-02 NOTE — Telephone Encounter (Signed)
Requesting Triamcinolone lotion 1.0%-YTRZN 1 application topically tid. Last refill:06-19-13;9ml,0 Last OV:06-19-13 Please advise.//AB/CMA

## 2013-07-04 ENCOUNTER — Telehealth: Payer: Self-pay | Admitting: *Deleted

## 2013-07-04 MED ORDER — HYDROCODONE-ACETAMINOPHEN 5-325 MG PO TABS: ORAL_TABLET | ORAL | Status: DC

## 2013-07-04 NOTE — Telephone Encounter (Signed)
Patient is requesting refill for HYDROcodone-acetaminophen (NORCO/VICODIN) 5-325 MG per tablet Last refill: 05/02/2013 #30 Last OV: 06/19/2013 UDS due, low risk

## 2013-07-04 NOTE — Telephone Encounter (Signed)
HYDROcodone-acetaminophen (NORCO/VICODIN) 5-325 MG per tablet Last OV-06/19/13 Last refilled- 05/02/13 #30 / 0 rf  UDS- 12/13/12 Low risk

## 2013-07-04 NOTE — Telephone Encounter (Signed)
done

## 2013-07-05 NOTE — Telephone Encounter (Signed)
Pt notified . Will pick up.

## 2013-07-13 DIAGNOSIS — L259 Unspecified contact dermatitis, unspecified cause: Secondary | ICD-10-CM | POA: Diagnosis not present

## 2013-07-25 ENCOUNTER — Encounter: Payer: Self-pay | Admitting: Internal Medicine

## 2013-07-25 ENCOUNTER — Ambulatory Visit (INDEPENDENT_AMBULATORY_CARE_PROVIDER_SITE_OTHER): Payer: Medicare Other | Admitting: Internal Medicine

## 2013-07-25 VITALS — BP 119/69 | HR 86 | Temp 97.9°F | Wt 237.0 lb

## 2013-07-25 DIAGNOSIS — R21 Rash and other nonspecific skin eruption: Secondary | ICD-10-CM | POA: Diagnosis not present

## 2013-07-25 DIAGNOSIS — I10 Essential (primary) hypertension: Secondary | ICD-10-CM

## 2013-07-25 MED ORDER — CARVEDILOL 6.25 MG PO TABS: 6.2500 mg | ORAL_TABLET | Freq: Two times a day (BID) | ORAL | Status: DC

## 2013-07-25 NOTE — Progress Notes (Signed)
Pre visit review using our clinic review tool, if applicable. No additional management support is needed unless otherwise documented below in the visit note. 

## 2013-07-25 NOTE — Progress Notes (Signed)
Subjective:    Patient ID: Wayne Nash, male    DOB: [birth year], 80 y.o.   MRN: [id number]  DOS:  07/25/2013 Type of  Visit: Followup from previous visits History: Was seen here recently with a rash, subsequently he saw dermatology, they prescribe a cream and was told that rash probably has to do with his blood pressure medication. The rash on the abdomen seems better, slightly worse on the arms.   ROS Other than the rash, he feels well, no fever chills, arthralgias.   Past Medical History  Diagnosis Date  . Hyperlipidemia   . Hypertension     Past Surgical History  Procedure Laterality Date  . Colonoscopy  2012     negative  . Appendectomy    . Lumbar disc surgery      Dr Shellia Carwin    History   Social History  . Marital Status: Divorced    Spouse Name: N/A    Number of Children: 3  . Years of Education: N/A   Occupational History  . [employer] delivery    Social History Main Topics  . Smoking status: Former Smoker    Quit date: 02/22/1994  . Smokeless tobacco: Not on file     Comment: smoked 1953-1996, up to 1.5 ppd  . Alcohol Use: No     Comment:  quit 1985  . Drug Use: No  . Sexual Activity: Not on file   Other Topics Concern  . Not on file   Social History Narrative   7 Gc, 3 GGchildren   lives by himself         Medication List       This list is accurate as of: 07/25/13 11:59 PM.  Always use your most recent med list.               aspirin 81 MG tablet  Take 81 mg by mouth daily.     atorvastatin 20 MG tablet  Commonly known as:  LIPITOR  1 by mouth daily     carvedilol 6.25 MG tablet  Commonly known as:  COREG  Take 1 tablet (6.25 mg total) by mouth 2 (two) times daily with a meal.     cyclobenzaprine 5 MG tablet  Commonly known as:  FLEXERIL  TAKE 1 TABLET (5 MG TOTAL) BY MOUTH AT BEDTIME AS NEEDED FOR MUSCLE SPASMS (CRAMPS).     HYDROcodone-acetaminophen 5-325 MG per tablet  Commonly known as:  NORCO/VICODIN    TAKE 1 TABLET BY MOUTH EVERY 6 HOURS AS NEEDED FOR PAIN     predniSONE 10 MG tablet  Commonly known as:  DELTASONE  Take 1 tablet (10 mg total) by mouth daily. 2 tabs a day x 5 days     triamcinolone lotion 0.1 %  Commonly known as:  KENALOG  APPLY 1 APPLICATION TOPICALLY 3 (THREE) TIMES DAILY.           Objective:   Physical Exam BP 119/69  Pulse 86  Temp(Src) 97.9 F (36.6 C)  Wt 237 lb (107.502 kg)  SpO2 93% General -- alert, well-developed, NAD.  Skin-- See previous note, rash in the abdomen and back about the same except that  seems to be less red than before. The skin on the forearms is very dry, slightly red, some capillary fragility noted as well Extremities-- mild pretibial edema bilaterally  Neurologic--  alert & oriented X3. Speech normal, gait appropriate for age, strength symmetric and appropriate for age.  Psych-- Cognition and judgment appear intact. Cooperative with normal attention span and concentration. No anxious or depressed appearing.        Assessment & Plan:

## 2013-07-25 NOTE — Assessment & Plan Note (Addendum)
Rash slightly better today @ abdomen and back, skin in the arms seems slightly more red. Patient went to see dermatology, they suggested rash probably was related to BP medications Plan:  Change BP meds although I'm not sure if they are the culprit of the rash. Get derm notes

## 2013-07-25 NOTE — Assessment & Plan Note (Signed)
Will discontinue benazepril hydrochlorothiazide in favor of carvedilol d/t a rash, see "rash" comments  Will start with low dose, increase if needed, see instructions

## 2013-07-25 NOTE — Patient Instructions (Signed)
Stop benazepril -Hydrochlorothiazide Start carvedilol 1 tablet twice a day  Check the  blood pressure 2 or 3 times a  week be sure it is between 110/60 and 140/85. Ideal blood pressure is 120/80. If it is consistently higher or lower, let me know  Call with BP readings in 10 days   Come back in one month for a routine visit

## 2013-07-27 ENCOUNTER — Other Ambulatory Visit: Payer: Self-pay | Admitting: Internal Medicine

## 2013-07-27 ENCOUNTER — Telehealth: Payer: Self-pay | Admitting: *Deleted

## 2013-07-27 NOTE — Telephone Encounter (Addendum)
Note reviewed, rash improved with triamcinolone except in the arms. Arm redness likely  photosensitivity due to HCTZ, at the time he declined to discontinue that medication and will wear long sleeves But after her last office visit we agreed to change BP medication. I agree with the diagnoses of photosensitivity, Patient will call with BP readings in few days, we'll see how he is doing

## 2013-07-27 NOTE — Telephone Encounter (Signed)
[facility] to obtain copies of the patients office visit. They will fax them over today.

## 2013-08-14 ENCOUNTER — Other Ambulatory Visit: Payer: Self-pay | Admitting: Dermatology

## 2013-08-14 DIAGNOSIS — Z09 Encounter for follow-up examination after completed treatment for conditions other than malignant neoplasm: Secondary | ICD-10-CM | POA: Diagnosis not present

## 2013-08-14 DIAGNOSIS — B86 Scabies: Secondary | ICD-10-CM | POA: Diagnosis not present

## 2013-08-14 DIAGNOSIS — D485 Neoplasm of uncertain behavior of skin: Secondary | ICD-10-CM | POA: Diagnosis not present

## 2013-08-14 DIAGNOSIS — L259 Unspecified contact dermatitis, unspecified cause: Secondary | ICD-10-CM | POA: Diagnosis not present

## 2013-08-27 ENCOUNTER — Telehealth: Payer: Self-pay | Admitting: Internal Medicine

## 2013-08-27 ENCOUNTER — Telehealth: Payer: Self-pay | Admitting: *Deleted

## 2013-08-27 ENCOUNTER — Encounter: Payer: Self-pay | Admitting: Internal Medicine

## 2013-08-27 ENCOUNTER — Ambulatory Visit (INDEPENDENT_AMBULATORY_CARE_PROVIDER_SITE_OTHER): Payer: Medicare Other | Admitting: Internal Medicine

## 2013-08-27 VITALS — BP 139/77 | HR 64 | Temp 97.9°F | Wt 239.0 lb

## 2013-08-27 DIAGNOSIS — R21 Rash and other nonspecific skin eruption: Secondary | ICD-10-CM

## 2013-08-27 DIAGNOSIS — I10 Essential (primary) hypertension: Secondary | ICD-10-CM

## 2013-08-27 NOTE — Telephone Encounter (Signed)
Thx, we got an appointment for him w/  Dr Denna Haggard

## 2013-08-27 NOTE — Assessment & Plan Note (Signed)
Good compliance with carvedilol, ambulatory BP satisfactory, no change

## 2013-08-27 NOTE — Telephone Encounter (Signed)
Spoke with patient who wanted to know what he could do for his rash until he saw dermatology again. I advised Aveeno oatmeal bath once daily as well as gold bond powder to rash once daily as needed. Patient will try this and follow up with dermatology as well.

## 2013-08-27 NOTE — Progress Notes (Signed)
Subjective:    Patient ID: IZMAEL DUROSS, male    DOB: December 05, 1933, 80 y.o.   MRN: [id number]  DOS:  08/27/2013 Type of visit - description: Followup  History: Since the last time he was here, we change his BP medication, ambulatory BPs 135, 145. A new rash developed, he saw dermatology recently and was Rx invermectin.  Not getting any better, + pruritus    ROS  Denies chest pain, difficulty breathing or extremity edema No nausea, vomiting, diarrhea  Past Medical History  Diagnosis Date  . Hyperlipidemia   . Hypertension     Past Surgical History  Procedure Laterality Date  . Colonoscopy  2012     negative  . Appendectomy    . Lumbar disc surgery      Dr Shellia Carwin    History   Social History  . Marital Status: Divorced    Spouse Name: N/A    Number of Children: 3  . Years of Education: N/A   Occupational History  . [employer]    Social History Main Topics  . Smoking status: Former Smoker    Quit date: 02/22/1994  . Smokeless tobacco: Not on file     Comment: smoked 1953-1996, up to 1.5 ppd  . Alcohol Use: No     Comment:  quit 1985  . Drug Use: No  . Sexual Activity: Not on file   Other Topics Concern  . Not on file   Social History Narrative   7 Gc, 3 GGchildren   lives by himself         Medication List       This list is accurate as of: 08/27/13 10:35 AM.  Always use your most recent med list.               aspirin 81 MG tablet  Take 81 mg by mouth daily.     atorvastatin 20 MG tablet  Commonly known as:  LIPITOR  1 by mouth daily     carvedilol 6.25 MG tablet  Commonly known as:  COREG  Take 1 tablet (6.25 mg total) by mouth 2 (two) times daily with a meal.     cyclobenzaprine 5 MG tablet  Commonly known as:  FLEXERIL  TAKE 1 TABLET BY MOUTH AT BEDTIME AS NEEDED FOR MUSCLE SPASMS/CRAMPS     HYDROcodone-acetaminophen 5-325 MG per tablet  Commonly known as:  NORCO/VICODIN  TAKE 1 TABLET BY MOUTH EVERY 6  HOURS AS NEEDED FOR PAIN     ivermectin 3 MG Tabs tablet  Commonly known as:  STROMECTOL  Take 21 mg by mouth once.     predniSONE 10 MG tablet  Commonly known as:  DELTASONE  Take 1 tablet (10 mg total) by mouth daily. 2 tabs a day x 5 days     triamcinolone lotion 0.1 %  Commonly known as:  KENALOG  APPLY 1 APPLICATION TOPICALLY 3 (THREE) TIMES DAILY.           Objective:   Physical Exam  Skin:      BP 139/77  Pulse 64  Temp(Src) 97.9 F (36.6 C) (Oral)  Wt 239 lb (108.41 kg)  SpO2 94%  General -- alert, well-developed, NAD.   Neurologic--  alert & oriented X3. Speech normal, gait appropriate for age, strength symmetric and appropriate for age.    Psych-- Cognition and judgment appear intact. Cooperative with normal attention span and concentration. No anxious or depressed appearing.  Assessment & Plan:

## 2013-08-27 NOTE — Telephone Encounter (Signed)
Pt has already been treated with  [facility], will not need to be seen again

## 2013-08-27 NOTE — Assessment & Plan Note (Signed)
Since the last time he was here 07/25/2013, BP medications were switched, upper abdomen rash much improved. About 2 weeks ago he developed a new rash in the lower abdomen, see physical exam, went to see Dr. Denna Haggard on 08/24/2013 was prescribed Ivermectin, not improving. Seems like this rash is unrelated to previous rash, etiology not clear to me. & Plan: We will ask Dr. Denna Haggard to reasses.

## 2013-08-27 NOTE — Progress Notes (Signed)
Pre visit review using our clinic review tool, if applicable. No additional management support is needed unless otherwise documented below in the visit note. 

## 2013-09-03 ENCOUNTER — Emergency Department (HOSPITAL_BASED_OUTPATIENT_CLINIC_OR_DEPARTMENT_OTHER)
Admission: EM | Admit: 2013-09-03 | Discharge: 2013-09-03 | Disposition: A | Discharge status: Home / Self Care | Payer: Medicare Other | Attending: Emergency Medicine | Admitting: Emergency Medicine

## 2013-09-03 ENCOUNTER — Encounter (HOSPITAL_BASED_OUTPATIENT_CLINIC_OR_DEPARTMENT_OTHER): Payer: Self-pay | Admitting: Emergency Medicine

## 2013-09-03 DIAGNOSIS — Z79899 Other long term (current) drug therapy: Secondary | ICD-10-CM | POA: Insufficient documentation

## 2013-09-03 DIAGNOSIS — R21 Rash and other nonspecific skin eruption: Secondary | ICD-10-CM | POA: Insufficient documentation

## 2013-09-03 DIAGNOSIS — IMO0002 Reserved for concepts with insufficient information to code with codable children: Secondary | ICD-10-CM | POA: Insufficient documentation

## 2013-09-03 DIAGNOSIS — I10 Essential (primary) hypertension: Secondary | ICD-10-CM | POA: Insufficient documentation

## 2013-09-03 DIAGNOSIS — Z87891 Personal history of nicotine dependence: Secondary | ICD-10-CM | POA: Diagnosis not present

## 2013-09-03 DIAGNOSIS — E785 Hyperlipidemia, unspecified: Secondary | ICD-10-CM | POA: Insufficient documentation

## 2013-09-03 DIAGNOSIS — Z9889 Other specified postprocedural states: Secondary | ICD-10-CM | POA: Insufficient documentation

## 2013-09-03 DIAGNOSIS — Z880 Allergy status to penicillin: Secondary | ICD-10-CM | POA: Diagnosis not present

## 2013-09-03 DIAGNOSIS — Z7982 Long term (current) use of aspirin: Secondary | ICD-10-CM | POA: Diagnosis not present

## 2013-09-03 MED ORDER — PREDNISONE 50 MG PO TABS: ORAL_TABLET | ORAL | Status: DC

## 2013-09-03 NOTE — ED Provider Notes (Signed)
CSN: [id number]     Arrival date & time 09/03/13  0612 History   First MD Initiated Contact with Patient 09/03/13 0630     Chief Complaint  Patient presents with  . Rash      Patient is a 80 y.o. male presenting with rash. The history is provided by the patient.  Rash Location: abdomen/legs. Severity:  Moderate Onset quality:  Gradual Timing:  Constant Progression:  Worsening Chronicity:  New Relieved by:  None tried Worsened by:  Nothing tried Associated symptoms: no fever and not vomiting   pt reports recent treatment with ivermectin for a rash.  That rash seemed to improved, but over past several days he has noticed rash to abdomen/thighs.  He reports mild pruritis.  No fever/vomiting.  No known insect/tick bites.    Past Medical History  Diagnosis Date  . Hyperlipidemia   . Hypertension    Past Surgical History  Procedure Laterality Date  . Colonoscopy  2012     negative  . Appendectomy    . Lumbar disc surgery      Dr Shellia Carwin   Family History  Problem Relation Age of Onset  . Cancer Mother     pancreatic  . Diabetes Neg Hx   . Heart disease Neg Hx   . Stroke Neg Hx    History  Substance Use Topics  . Smoking status: Former Smoker    Quit date: 02/22/1994  . Smokeless tobacco: Not on file     Comment: smoked 1953-1996, up to 1.5 ppd  . Alcohol Use: No     Comment:  quit 1985    Review of Systems  Constitutional: Negative for fever.  Gastrointestinal: Negative for vomiting.  Skin: Positive for rash.      Allergies  Penicillins  Home Medications   Prior to Admission medications   Medication Sig Start Date End Date Taking? Authorizing Provider  atorvastatin (LIPITOR) 20 MG tablet 1 by mouth daily 09/22/12  Yes Hendricks Limes, MD  carvedilol (COREG) 6.25 MG tablet Take 1 tablet (6.25 mg total) by mouth 2 (two) times daily with a meal. 07/25/13  Yes Colon Branch, MD  cyclobenzaprine (FLEXERIL) 5 MG tablet TAKE 1 TABLET BY MOUTH AT BEDTIME AS NEEDED  FOR MUSCLE SPASMS/CRAMPS 07/27/13  Yes Colon Branch, MD  HYDROcodone-acetaminophen (NORCO/VICODIN) 5-325 MG per tablet TAKE 1 TABLET BY MOUTH EVERY 6 HOURS AS NEEDED FOR PAIN 07/04/13  Yes Colon Branch, MD  triamcinolone lotion (KENALOG) 0.1 % APPLY 1 APPLICATION TOPICALLY 3 (THREE) TIMES DAILY.   Yes Colon Branch, MD  aspirin 81 MG tablet Take 81 mg by mouth daily.    Historical Provider, MD  ivermectin (STROMECTOL) 3 MG TABS tablet Take 21 mg by mouth once.    Historical Provider, MD  predniSONE (DELTASONE) 50 MG tablet One tablet PO daily for 5 days 09/03/13   Sharyon Cable, MD   BP 161/68  Pulse 78  Temp(Src) 98.1 F (36.7 C) (Oral)  Resp 16  Ht 5\' 9"  (1.753 m)  Wt 220 lb (99.791 kg)  BMI 32.47 kg/m2  SpO2 95% Physical Exam CONSTITUTIONAL: Well developed/well nourished HEAD: Normocephalic/atraumatic EYES: EOMI ENMT: Mucous membranes moist NECK: supple no meningeal signs CV: S1/S2 noted, no murmurs/rubs/gallops noted LUNGS:  no apparent distress ABDOMEN: soft GU:no cva tenderness NEURO: Pt is awake/alert, moves all extremitiesx4 EXTREMITIES: pulses normal, full ROM SKIN: erythematous rash to abdomen/bilateral that thighs.  It has a scaly appearance.  No drainage noted.  No rash noted to palms of hands PSYCH: no abnormalities of mood noted  ED Course  Procedures   Review of chart reveals pt had recent treatment of scabies with ivermectin and he reports this is improved.  He has since developed rash to abd/thighs.  He also has had multiple PCP visits in past several months for rash.    Will d/c home with short course of prednisone.  If it does not improve he will need to see his PCP or dermatologist.  He is well appearing, nontoxic and appropriate for outpatient management  MDM   Final diagnoses:  Rash    Nursing notes including past medical history and social history reviewed and considered in documentation     Sharyon Cable, MD 09/03/13 [phone]

## 2013-09-03 NOTE — ED Notes (Signed)
Pt went to see PCP and was told he was bite by a mite patient was placed on a medication ivermectin 3mg  and told to take all 7 pills at once. Pt now has red bumps on his skin. Pt upper legs/thighs are the worst with some redness to chest and arms.

## 2013-09-13 ENCOUNTER — Other Ambulatory Visit: Payer: Self-pay | Admitting: Internal Medicine

## 2013-10-02 ENCOUNTER — Encounter: Payer: Self-pay | Admitting: General Practice

## 2013-10-02 ENCOUNTER — Ambulatory Visit (INDEPENDENT_AMBULATORY_CARE_PROVIDER_SITE_OTHER): Payer: Medicare Other | Admitting: Internal Medicine

## 2013-10-02 ENCOUNTER — Encounter: Payer: Self-pay | Admitting: Internal Medicine

## 2013-10-02 VITALS — BP 147/71 | HR 66 | Temp 97.9°F | Wt 237.2 lb

## 2013-10-02 DIAGNOSIS — R21 Rash and other nonspecific skin eruption: Secondary | ICD-10-CM | POA: Diagnosis not present

## 2013-10-02 MED ORDER — PERMETHRIN 5 % EX CREA: 1.0000 "application " | TOPICAL_CREAM | Freq: Once | CUTANEOUS | Status: DC

## 2013-10-02 NOTE — Patient Instructions (Signed)
Apply from the neck down everywhere including around the  genital area, between the fingers, between the toes and under arm. After applying it, go to sleep and take a  shower 8-12 hours later  Wash all bedding and clothing in [place] water   Call me in 1 week and let me know how you are doing

## 2013-10-02 NOTE — Progress Notes (Signed)
Subjective:    Patient ID: Wayne Nash, male    DOB: 12-Nov-1933, 80 y.o.   MRN: [id number]  DOS:  10/02/2013 Type of visit - description:  Followup History: not doing better, continue with a rash. Went to see Dr. Denna Haggard,  i saw his report a couple of days ago --it is in transit to be scan-- they found scabies in one of the skin biopsies, he was prescribed ivermectin , he took it several weeks ago and rash-pruritus has not improved.      Past Medical History  Diagnosis Date  . Hyperlipidemia   . Hypertension     Past Surgical History  Procedure Laterality Date  . Colonoscopy  2012     negative  . Appendectomy    . Lumbar disc surgery      Dr Shellia Carwin    History   Social History  . Marital Status: Divorced    Spouse Name: N/A    Number of Children: 3  . Years of Education: N/A   Occupational History  . [employer]    Social History Main Topics  . Smoking status: Former Smoker    Quit date: 02/22/1994  . Smokeless tobacco: Not on file     Comment: smoked 1953-1996, up to 1.5 ppd  . Alcohol Use: No     Comment:  quit 1985  . Drug Use: No  . Sexual Activity: Not on file   Other Topics Concern  . Not on file   Social History Narrative   7 Gc, 3 GGchildren   lives by himself         Medication List       This list is accurate as of: 10/02/13  8:47 PM.  Always use your most recent med list.               aspirin 81 MG tablet  Take 81 mg by mouth daily.     atorvastatin 20 MG tablet  Commonly known as:  LIPITOR  TAKE 1 TABLET EVERY DAY     benazepril-hydrochlorthiazide 10-12.5 MG per tablet  Commonly known as:  LOTENSIN HCT  TAKE 1 TABLET EVERY DAY     carvedilol 6.25 MG tablet  Commonly known as:  COREG  Take 1 tablet (6.25 mg total) by mouth 2 (two) times daily with a meal.     cyclobenzaprine 5 MG tablet  Commonly known as:  FLEXERIL  TAKE 1 TABLET BY MOUTH AT BEDTIME AS NEEDED FOR MUSCLE SPASMS/CRAMPS     HYDROcodone-acetaminophen 5-325 MG per tablet  Commonly known as:  NORCO/VICODIN  TAKE 1 TABLET BY MOUTH EVERY 6 HOURS AS NEEDED FOR PAIN     ivermectin 3 MG Tabs tablet  Commonly known as:  STROMECTOL  Take 21 mg by mouth once.     permethrin 5 % cream  Commonly known as:  ELIMITE  Apply 1 application topically once. Apply from neck down , take a shower 8-12 hour later     predniSONE 50 MG tablet  Commonly known as:  DELTASONE  One tablet PO daily for 5 days     triamcinolone lotion 0.1 %  Commonly known as:  KENALOG  APPLY 1 APPLICATION TOPICALLY 3 (THREE) TIMES DAILY.           Objective:   Physical Exam BP 147/71  Pulse 66  Temp(Src) 97.9 F (36.6 C) (Oral)  Wt 237 lb 4 oz (107.616 kg)  SpO2 94%  Continue with a  patchy erythematous rash pelvis > abdomen = back LE> UE     Assessment & Plan:

## 2013-10-02 NOTE — Assessment & Plan Note (Addendum)
Was eventually seen by dermatology, biopsy 08-14-13 showed scabies, status post  ivernmectin, not better. Did wash all his bedding. Plan: Discuss in detail what scabies is. Wash all his bedding and clothing Trial with Elimite Repeat Rx if no better, asked pt to call me in 1 week

## 2013-10-02 NOTE — Progress Notes (Signed)
Pre-visit discussion using our clinic review tool. No additional management support is needed unless otherwise documented below in the visit note.  

## 2013-10-09 ENCOUNTER — Telehealth: Payer: Self-pay | Admitting: Internal Medicine

## 2013-10-09 NOTE — Telephone Encounter (Signed)
The patient was recently treated for scabies, I called him today and he states that he is doing much better. Encourage to call for questions or concerns

## 2013-10-11 ENCOUNTER — Telehealth: Payer: Self-pay | Admitting: *Deleted

## 2013-10-11 NOTE — Telephone Encounter (Signed)
Please Advise

## 2013-10-11 NOTE — Telephone Encounter (Signed)
Caller name: Braylon Relation to pt:  self Call back number: [phone] Pharmacy: [facility]  Reason for call: Pt came in office.  States Dr. Larose Kells called either last night or the night before to check on him.  Pt states Dr. Larose Kells told him he called in a cream to stop the itching.  [facility] just [facility] and they did not have anything for him.  The only cream I see in the pt med chart is the one he was prescribed at the [place] 10/02/2013.  Please advise pt if he is supposed to be getting a prescription for itching.

## 2013-10-12 MED ORDER — PERMETHRIN 5 % EX CREA: 1.0000 "application " | TOPICAL_CREAM | Freq: Once | CUTANEOUS | Status: DC

## 2013-10-12 NOTE — Telephone Encounter (Signed)
I asked him to call me if he was still itching, so if still itching, send a refill for ELIMITE, needs to  do the treatment one more time

## 2013-10-12 NOTE — Telephone Encounter (Signed)
Spoke with Pt, he is somewhat better but not completely, [facility]

## 2013-11-19 ENCOUNTER — Telehealth: Payer: Self-pay | Admitting: Internal Medicine

## 2013-11-19 MED ORDER — HYDROCODONE-ACETAMINOPHEN 5-325 MG PO TABS: ORAL_TABLET | ORAL | Status: DC

## 2013-11-19 NOTE — Telephone Encounter (Signed)
Pt is requesting refill on Hydrocodone.  Last OV: 10/02/2013 Last Fill: 07/04/2013 # 60 0 RF UDS: 10/22/201 Low risk   Please advise.

## 2013-11-19 NOTE — Telephone Encounter (Signed)
done

## 2013-11-19 NOTE — Telephone Encounter (Signed)
Pt is needing new rx HYDROcodone-acetaminophen (NORCO/VICODIN) 5-325 MG per tablet °Please call when available for pick up °

## 2013-11-20 NOTE — Telephone Encounter (Signed)
Tried calling Pt, no answer, medication placed at front desk for pick up.

## 2013-12-03 ENCOUNTER — Other Ambulatory Visit: Payer: Self-pay

## 2013-12-03 MED ORDER — CARVEDILOL 6.25 MG PO TABS: 6.2500 mg | ORAL_TABLET | Freq: Two times a day (BID) | ORAL | Status: DC

## 2013-12-11 ENCOUNTER — Ambulatory Visit (INDEPENDENT_AMBULATORY_CARE_PROVIDER_SITE_OTHER): Payer: Medicare Other | Admitting: Internal Medicine

## 2013-12-11 ENCOUNTER — Encounter: Payer: Self-pay | Admitting: Internal Medicine

## 2013-12-11 ENCOUNTER — Telehealth: Payer: Self-pay | Admitting: Internal Medicine

## 2013-12-11 VITALS — BP 124/62 | HR 74 | Temp 97.6°F | Wt 236.0 lb

## 2013-12-11 DIAGNOSIS — R609 Edema, unspecified: Secondary | ICD-10-CM

## 2013-12-11 DIAGNOSIS — R21 Rash and other nonspecific skin eruption: Secondary | ICD-10-CM | POA: Diagnosis not present

## 2013-12-11 DIAGNOSIS — Z23 Encounter for immunization: Secondary | ICD-10-CM | POA: Diagnosis not present

## 2013-12-11 DIAGNOSIS — I10 Essential (primary) hypertension: Secondary | ICD-10-CM | POA: Diagnosis not present

## 2013-12-11 DIAGNOSIS — R7303 Prediabetes: Secondary | ICD-10-CM

## 2013-12-11 DIAGNOSIS — R269 Unspecified abnormalities of gait and mobility: Secondary | ICD-10-CM

## 2013-12-11 DIAGNOSIS — R7309 Other abnormal glucose: Secondary | ICD-10-CM | POA: Diagnosis not present

## 2013-12-11 LAB — COMPREHENSIVE METABOLIC PANEL
ALBUMIN: 3.2 g/dL — AB (ref 3.5–5.2)
ALT: 9 U/L (ref 0–53)
AST: 13 U/L (ref 0–37)
Alkaline Phosphatase: 94 U/L (ref 39–117)
BUN: 13 mg/dL (ref 6–23)
CO2: 27 meq/L (ref 19–32)
Calcium: 9.4 mg/dL (ref 8.4–10.5)
Chloride: 105 mEq/L (ref 96–112)
Creatinine, Ser: 0.7 mg/dL (ref 0.4–1.5)
GFR: 115.83 mL/min (ref >60.00)
GLUCOSE: 104 mg/dL — AB (ref 70–99)
POTASSIUM: 4 meq/L (ref 3.5–5.1)
SODIUM: 143 meq/L (ref 135–145)
TOTAL PROTEIN: 6.6 g/dL (ref 6.0–8.3)
Total Bilirubin: 0.5 mg/dL (ref 0.2–1.2)

## 2013-12-11 LAB — CBC WITH DIFFERENTIAL/PLATELET
Basophils Absolute: 0.1 10*3/uL (ref 0.0–0.1)
Basophils Relative: 0.7 % (ref 0.0–3.0)
EOS PCT: 4.4 % (ref 0.0–5.0)
Eosinophils Absolute: 0.3 10*3/uL (ref 0.0–0.7)
HCT: 45.1 % (ref 39.0–52.0)
Hemoglobin: 14.4 g/dL (ref 13.0–17.0)
Lymphocytes Relative: 12.2 % (ref 12.0–46.0)
Lymphs Abs: 1 10*3/uL (ref 0.7–4.0)
MCHC: 31.8 g/dL (ref 30.0–36.0)
MCV: 79.3 fl (ref 78.0–100.0)
MONOS PCT: 9.2 % (ref 3.0–12.0)
Monocytes Absolute: 0.7 10*3/uL (ref 0.1–1.0)
Neutro Abs: 5.8 10*3/uL (ref 1.4–7.7)
Neutrophils Relative %: 73.5 % (ref 43.0–77.0)
PLATELETS: 184 10*3/uL (ref 150.0–400.0)
RBC: 5.69 Mil/uL (ref 4.22–5.81)
RDW: 16.3 % — ABNORMAL HIGH (ref 11.5–15.5)
WBC: 7.9 10*3/uL (ref 4.0–10.5)

## 2013-12-11 LAB — TSH: TSH: 1.1 u[IU]/mL (ref 0.35–4.50)

## 2013-12-11 LAB — HEMOGLOBIN A1C: HEMOGLOBIN A1C: 6.1 % (ref 4.6–6.5)

## 2013-12-11 MED ORDER — BENAZEPRIL HCL 10 MG PO TABS: 5.0000 mg | ORAL_TABLET | Freq: Every day | ORAL | Status: DC

## 2013-12-11 MED ORDER — HYDROCODONE-ACETAMINOPHEN 5-325 MG PO TABS: ORAL_TABLET | ORAL | Status: DC

## 2013-12-11 MED ORDER — BENAZEPRIL HCL 10 MG PO TABS: 10.0000 mg | ORAL_TABLET | Freq: Every day | ORAL | Status: DC

## 2013-12-11 MED ORDER — FUROSEMIDE 20 MG PO TABS: 20.0000 mg | ORAL_TABLET | Freq: Every day | ORAL | Status: DC

## 2013-12-11 NOTE — Assessment & Plan Note (Signed)
Status post treatment for scabies, although the patient states that he is much better but "not completely well", I don't see any active lesions in the skin.

## 2013-12-11 NOTE — Progress Notes (Signed)
Subjective:    Patient ID: Wayne Nash, male    DOB: 1933-04-23, 80 y.o.   MRN: [id number]  DOS:  12/11/2013 Type of visit - description : acute Interval history: Chief complaint today is lower extremity edema, worse on the left, this is going on for a while. He is concerned because apparently the swelling is impairing his ability to walk . The patient could not elaborate on his difficulty to walk. He is not having pain at any specific area of the  leg , denies classic claudication. Does not add salt to his food   ROS Denies fever or chills No chest pain or difficulty breathing, no DOE but he is not very active. No orthopnea He is a former smoker, denies cough or sputum production. Was seen a few times recently with a rash, is better   Past Medical History  Diagnosis Date  . Hyperlipidemia   . Hypertension     Past Surgical History  Procedure Laterality Date  . Colonoscopy  2012     negative  . Appendectomy    . Lumbar disc surgery      Dr Shellia Carwin    History   Social History  . Marital Status: Divorced    Spouse Name: N/A    Number of Children: 3  . Years of Education: N/A   Occupational History  . 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 Smoking status: Former Smoker    Quit date: 02/22/1994  . Smokeless tobacco: Not on file     Comment: smoked 1953-1996, up to 1.5 ppd  . Alcohol Use: No     Comment:  quit 1985  . Drug Use: No  . Sexual Activity: Not on file   Other Topics Concern  . Not on file   Social History Narrative   7 Gc, 3 GGchildren   lives by himself         Medication List       This list is accurate as of: 12/11/13  6:30 PM.  Always use your most recent med list.               aspirin 81 MG tablet  Take 81 mg by mouth daily.     atorvastatin 20 MG tablet  Commonly known as:  LIPITOR  TAKE 1 TABLET EVERY DAY     benazepril 10 MG tablet  Commonly known as:  LOTENSIN    Take 0.5 tablets (5 mg total) by mouth daily.     carvedilol 6.25 MG tablet  Commonly known as:  COREG  Take 1 tablet (6.25 mg total) by mouth 2 (two) times daily with a meal.     cyclobenzaprine 5 MG tablet  Commonly known as:  FLEXERIL  TAKE 1 TABLET BY MOUTH AT BEDTIME AS NEEDED FOR MUSCLE SPASMS/CRAMPS     furosemide 20 MG tablet  Commonly known as:  LASIX  Take 1 tablet (20 mg total) by mouth daily.     HYDROcodone-acetaminophen 5-325 MG per tablet  Commonly known as:  NORCO/VICODIN  TAKE 1 TABLET BY MOUTH EVERY 6 HOURS AS NEEDED FOR PAIN     ivermectin 3 MG Tabs tablet  Commonly known as:  STROMECTOL  Take 21 mg by mouth once.     triamcinolone lotion 0.1 %  Commonly known as:  KENALOG  APPLY 1 APPLICATION TOPICALLY 3 (THREE) TIMES DAILY.  Objective:   Physical Exam BP 124/62  Pulse 74  Temp(Src) 97.6 F (36.4 C) (Oral)  Wt 236 lb (107.049 kg)  SpO2 94% General -- alert, well-developed, NAD.  Neck, no JVD at 45  HEENT-- Not pale.   Lungs -- normal respiratory effort, no intercostal retractions, no accessory muscle use. Decreased breath sounds B.  Heart-- normal rate, regular rhythm, no murmur.  Abdomen-- Not distended, good bowel sounds,soft, non-tender.  Extremities-- Soft  edema from the knees down, more noticeable on the left, left calf is 2 cm larger in circumference  compared to the R. Pedal pulse + B No skin redness or warmness at LE Skin-- upper extremities w/ few areas of postinflammatory hyperpigmentation, no active lesions. Neurologic--  alert & oriented X3. Speech normal; gait Is difficult, uses a cane, no motor deficits present. Psych-- Cognition and judgment appear intact. Cooperative with normal attention span and concentration. No anxious or depressed appearing.        Assessment & Plan:  Flu shot today

## 2013-12-11 NOTE — Patient Instructions (Addendum)
Get your blood work before you leave   Stop by the first floor and get the XR   Start benazepril 5 mg one tablet daily Start Lasix 20 mg one tablet daily Other medications the same  We'll schedule an echocardiogram to check your heart  Come back in 3 weeks for a checkup

## 2013-12-11 NOTE — Telephone Encounter (Signed)
Caller name: azari Relation to pt: self Call back number: [phone] Pharmacy:  Reason for call:   Patient is wanting to know which blood pressure pill he needs to take. He states that he has been taking carvedilol but that Dr. Larose Kells prescribed another med for him. Does he take new and and discontinue old med or take both?

## 2013-12-11 NOTE — Assessment & Plan Note (Addendum)
Lower extremity edema "for a while", worse on the left. On exam there is no JVD, denies DOE. EKG today Normal sinus rhythm and unchanged from previous. he's not taking calcium channel blockers. Sx  probably multifactorial, will check an echocardiogram, chest x-ray, he is a former smoker, at some point will need PFTs. Plan-- Labs Restart low dose benazepril  and Lasix Reassess in 3 weeks I doubt he needs an ultrasound to rule out DVT since the edema is chronic. Reassess on return to the office

## 2013-12-11 NOTE — Progress Notes (Signed)
Pre visit review using our clinic review tool, if applicable. No additional management support is needed unless otherwise documented below in the visit note. 

## 2013-12-11 NOTE — Telephone Encounter (Signed)
Spoke with Pt, informed him to take BOTH benazepril and carvedilol as instructed. Pt verbalized understanding.

## 2013-12-11 NOTE — Assessment & Plan Note (Addendum)
Seems well controlled but Has been taking only carvedilol, has not been taking Lotensin HCT. Reason?. Plan: Go back on Lotensin, low dose 5 mg, start Lasix 20 mg, watch for low BPs.

## 2013-12-11 NOTE — Assessment & Plan Note (Signed)
Complained of difficulty with his gait, exam is confirmatory, muscle strength is symmetric. Probably multifactorial including age, obesity, edema, and inactivity. Plan: Treat edema Reassess in 3 weeks

## 2013-12-12 DIAGNOSIS — H25013 Cortical age-related cataract, bilateral: Secondary | ICD-10-CM | POA: Diagnosis not present

## 2013-12-13 ENCOUNTER — Other Ambulatory Visit (HOSPITAL_COMMUNITY): Payer: Medicare Other

## 2013-12-13 ENCOUNTER — Ambulatory Visit (HOSPITAL_BASED_OUTPATIENT_CLINIC_OR_DEPARTMENT_OTHER)
Admission: RE | Admit: 2013-12-13 | Discharge: 2013-12-13 | Disposition: A | Discharge status: Home / Self Care | Payer: Medicare Other | Source: Ambulatory Visit | Attending: Internal Medicine | Admitting: Internal Medicine

## 2013-12-13 DIAGNOSIS — R609 Edema, unspecified: Secondary | ICD-10-CM | POA: Insufficient documentation

## 2013-12-19 ENCOUNTER — Ambulatory Visit (HOSPITAL_BASED_OUTPATIENT_CLINIC_OR_DEPARTMENT_OTHER)
Admission: RE | Admit: 2013-12-19 | Discharge: 2013-12-19 | Disposition: A | Discharge status: Home / Self Care | Payer: Medicare Other | Source: Ambulatory Visit | Attending: Internal Medicine | Admitting: Internal Medicine

## 2013-12-19 DIAGNOSIS — I369 Nonrheumatic tricuspid valve disorder, unspecified: Secondary | ICD-10-CM | POA: Diagnosis not present

## 2013-12-19 DIAGNOSIS — R0600 Dyspnea, unspecified: Secondary | ICD-10-CM | POA: Diagnosis not present

## 2013-12-19 DIAGNOSIS — I509 Heart failure, unspecified: Secondary | ICD-10-CM | POA: Insufficient documentation

## 2013-12-19 DIAGNOSIS — R609 Edema, unspecified: Secondary | ICD-10-CM

## 2013-12-19 DIAGNOSIS — I10 Essential (primary) hypertension: Secondary | ICD-10-CM | POA: Diagnosis not present

## 2013-12-19 DIAGNOSIS — E785 Hyperlipidemia, unspecified: Secondary | ICD-10-CM | POA: Insufficient documentation

## 2013-12-19 NOTE — Progress Notes (Signed)
Echocardiogram 2D Echocardiogram has been performed.  Wayne Nash 12/19/2013, 2:01 PM

## 2014-01-01 ENCOUNTER — Telehealth: Payer: Self-pay | Admitting: Internal Medicine

## 2014-01-01 ENCOUNTER — Ambulatory Visit (INDEPENDENT_AMBULATORY_CARE_PROVIDER_SITE_OTHER): Payer: Medicare Other | Admitting: Internal Medicine

## 2014-01-01 ENCOUNTER — Encounter: Payer: Self-pay | Admitting: Internal Medicine

## 2014-01-01 VITALS — BP 188/77 | HR 98 | Temp 97.5°F | Wt 238.0 lb

## 2014-01-01 DIAGNOSIS — R21 Rash and other nonspecific skin eruption: Secondary | ICD-10-CM

## 2014-01-01 DIAGNOSIS — I10 Essential (primary) hypertension: Secondary | ICD-10-CM

## 2014-01-01 DIAGNOSIS — R609 Edema, unspecified: Secondary | ICD-10-CM

## 2014-01-01 MED ORDER — FUROSEMIDE 20 MG PO TABS: 40.0000 mg | ORAL_TABLET | Freq: Every day | ORAL | Status: DC

## 2014-01-01 MED ORDER — CROTAMITON 10 % EX LOTN: TOPICAL_LOTION | Freq: Every day | CUTANEOUS | Status: DC

## 2014-01-01 MED ORDER — PERMETHRIN 5 % EX CREA: 1.0000 "application " | TOPICAL_CREAM | Freq: Once | CUTANEOUS | Status: DC

## 2014-01-01 NOTE — Telephone Encounter (Signed)
LMOM for Pt informing him to pick up Elimite, apply topically before bed on the neck down, sleep with it, and shower in the morning; repeating this twice. [facility]

## 2014-01-01 NOTE — Progress Notes (Signed)
Subjective:    Patient ID: Wayne Nash, male    DOB: 04-22-1933, 80 y.o.   MRN: [id number]  DOS:  01/01/2014 Type of visit - description : f/u Interval history: Hypertension, edema: Patient was a started on Lotensin and Lasix, edema is about the same, BP today is actually worse. Rash--- has resurface  ROS No fever chills No chest pain. Rash is not itching much at all  Past Medical History  Diagnosis Date  . Hyperlipidemia   . Hypertension     Past Surgical History  Procedure Laterality Date  . Colonoscopy  2012     negative  . Appendectomy    . Lumbar disc surgery      Dr Shellia Carwin    History   Social History  . Marital Status: Divorced    Spouse Name: N/A    Number of Children: 3  . Years of Education: N/A   Occupational History  . 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 Smoking status: Former Smoker    Quit date: 02/22/1994  . Smokeless tobacco: Not on file     Comment: smoked 1953-1996, up to 1.5 ppd  . Alcohol Use: No     Comment:  quit 1985  . Drug Use: No  . Sexual Activity: Not on file   Other Topics Concern  . Not on file   Social History Narrative   7 Gc, 3 GGchildren   lives by himself         Medication List       This list is accurate as of: 01/01/14  6:16 PM.  Always use your most recent med list.               aspirin 81 MG tablet  Take 81 mg by mouth daily.     atorvastatin 20 MG tablet  Commonly known as:  LIPITOR  TAKE 1 TABLET EVERY DAY     carvedilol 6.25 MG tablet  Commonly known as:  COREG  Take 1 tablet (6.25 mg total) by mouth 2 (two) times daily with a meal.     cyclobenzaprine 5 MG tablet  Commonly known as:  FLEXERIL  TAKE 1 TABLET BY MOUTH AT BEDTIME AS NEEDED FOR MUSCLE SPASMS/CRAMPS     furosemide 20 MG tablet  Commonly known as:  LASIX  Take 2 tablets (40 mg total) by mouth daily.     HYDROcodone-acetaminophen 5-325 MG per tablet  Commonly known  as:  NORCO/VICODIN  TAKE 1 TABLET BY MOUTH EVERY 6 HOURS AS NEEDED FOR PAIN     permethrin 5 % cream  Commonly known as:  ELIMITE  Apply 1 application topically once. Apply from neck down , take a shower 8-12 hour later     triamcinolone lotion 0.1 %  Commonly known as:  KENALOG  APPLY 1 APPLICATION TOPICALLY 3 (THREE) TIMES DAILY.           Objective:   Physical Exam BP 188/77 mmHg  Pulse 98  Temp(Src) 97.5 F (36.4 C) (Oral)  Wt 238 lb (107.956 kg)  SpO2 95%  General -- alert, well-developed, NAD.   Extremities-- soft +/+++ pretibial edema bilaterally  Skin-- multiple skin lesions on the abdomen and back, papular, slightly red, slightly scaly, some in a Christmas tree distribution, no classic burrows. Wrists and hands, interdigital spaces with no active lesions Neurologic--  alert & oriented X3. Speech normal, gait appropriate for age, strength  symmetric and appropriate for age.   Psych-- Cognition and judgment appear intact. Cooperative with normal attention span and concentration. No anxious or depressed appearing.       Assessment & Plan:

## 2014-01-01 NOTE — Assessment & Plan Note (Addendum)
At the last visit, benazepril and Lasix were rx , BP upon arrival to the office was elevated, I recheck it and continued to be 180/80 thus  is worse. Also his rash has resurface. Plan: D/c  benazepril, related to rash? Increase Lasix from 20 mg to 40 mg BMP in 10 days

## 2014-01-01 NOTE — Telephone Encounter (Signed)
Pt went to pick up prescription for crotamiton (EURAX) 10 % lotion and pharmacy told pt it would be $3000.  Pt will not pay that for prescription.  Please advise.  Send straight to [facility] per [facility]

## 2014-01-01 NOTE — Progress Notes (Signed)
Pre visit review using our clinic review tool, if applicable. No additional management support is needed unless otherwise documented below in the visit note. 

## 2014-01-01 NOTE — Assessment & Plan Note (Addendum)
Rash has gotten worse since the last visit, more noticeable at the abdomen and back. The   one thing he is doing different is taking Lasix and benazepril. Again on inspection, rash looked like pityriasis rosea however previously they dx scabies per a biopsy, s/p irvecmetin rx per derm and  Elimite x 2 Plan: Discontinue benazepril rx eurax understanding rash may be pityriasis rosea Addendum: Eurax very expensive, will try Elimite again

## 2014-01-01 NOTE — Assessment & Plan Note (Signed)
Started Lasix, edema is about the same, has gained 2 pounds. Plan:  Increase Lasix to 40 mg Discontinue benazepril, see comments under hypertension BMP in 10 days

## 2014-01-01 NOTE — Patient Instructions (Addendum)
Stop benazepril  Take furosemide 20 mg ---- 2 tablets in the morning  Schedule labs for next week  EURAX: Apply from the neck down at night, repeat application the next day, take a bath the next day  Check the  blood pressure daily  Be sure your blood pressure is between  145/85  and 110/65.  if it is consistently higher or lower, let me know    Office visit in 3 weeks

## 2014-01-01 NOTE — Telephone Encounter (Signed)
Advise patient, I sent a prescription for Elimite which he has used before. Recommend to use 2 nights in a row

## 2014-01-02 ENCOUNTER — Other Ambulatory Visit (INDEPENDENT_AMBULATORY_CARE_PROVIDER_SITE_OTHER): Payer: Medicare Other

## 2014-01-02 DIAGNOSIS — I10 Essential (primary) hypertension: Secondary | ICD-10-CM

## 2014-01-02 LAB — BASIC METABOLIC PANEL
BUN: 15 mg/dL (ref 6–23)
CHLORIDE: 100 meq/L (ref 96–112)
CO2: 36 meq/L — AB (ref 19–32)
CREATININE: 0.7 mg/dL (ref 0.4–1.5)
Calcium: 9.2 mg/dL (ref 8.4–10.5)
GFR: 121.82 mL/min (ref >60.00)
Glucose, Bld: 104 mg/dL — ABNORMAL HIGH (ref 70–99)
Potassium: 4.3 mEq/L (ref 3.5–5.1)
Sodium: 139 mEq/L (ref 135–145)

## 2014-01-15 ENCOUNTER — Telehealth: Payer: Self-pay | Admitting: Internal Medicine

## 2014-01-15 DIAGNOSIS — L989 Disorder of the skin and subcutaneous tissue, unspecified: Secondary | ICD-10-CM

## 2014-01-15 NOTE — Telephone Encounter (Signed)
If symptoms came back, please call [facility], they have seen him before, could they see him this week?. Send him my last 2 office visit notes

## 2014-01-15 NOTE — Telephone Encounter (Signed)
Pt was advised if infection reoccurred to inform PCP. Pt in need of clinical advice

## 2014-01-15 NOTE — Telephone Encounter (Signed)
Please advise 

## 2014-01-16 ENCOUNTER — Encounter: Payer: Self-pay | Admitting: Internal Medicine

## 2014-01-16 NOTE — Telephone Encounter (Signed)
Spoke with patient and advised. Would like to go to a dermatologist in [facility]. Request not to go back to [place]. Ok per Dr Larose Kells, new referral entered.

## 2014-01-21 ENCOUNTER — Encounter: Payer: Self-pay | Admitting: Medical

## 2014-01-21 ENCOUNTER — Ambulatory Visit (INDEPENDENT_AMBULATORY_CARE_PROVIDER_SITE_OTHER): Payer: Medicare Other | Admitting: Medical

## 2014-01-21 VITALS — BP 155/70 | HR 70 | Temp 97.9°F | Ht 60.5 in | Wt 238.2 lb

## 2014-01-21 DIAGNOSIS — R21 Rash and other nonspecific skin eruption: Secondary | ICD-10-CM | POA: Diagnosis not present

## 2014-01-21 MED ORDER — AMMONIUM LACTATE 12 % EX CREA: TOPICAL_CREAM | CUTANEOUS | Status: DC | PRN

## 2014-01-21 MED ORDER — HYDROXYZINE HCL 10 MG PO TABS: 10.0000 mg | ORAL_TABLET | Freq: Three times a day (TID) | ORAL | Status: DC | PRN

## 2014-01-21 MED ORDER — DOXYCYCLINE HYCLATE 100 MG PO TABS: 100.0000 mg | ORAL_TABLET | Freq: Two times a day (BID) | ORAL | Status: DC

## 2014-01-21 NOTE — Patient Instructions (Addendum)
The exact etiology of your  allergic reaction is  unknown . I will give you very low dose hydroxyzine for itching. I am prescribing doxycycline for possible folliculitis and lac-hydrin moisturizer for dry skin. If worsening or expanding rash please notify us. I will go ahead and refer you to 2nd dermatologist since no cause has been determined for 1 yr even after seeing a dermatologist.  Follow up in 7 days or as needed.

## 2014-01-21 NOTE — Assessment & Plan Note (Signed)
The exact etiology of your  allergic reaction is  unknown . I will give you very low dose hydroxyzine for itching. I am prescribing doxycycline for possible folliculitis and lac-hydrin moisturizer for dry skin. If worsening or expanding rash please notify us. I will go ahead and refer you to 2nd dermatologist since no cause has been determined for 1 yr even after seeing a dermatologist

## 2014-01-21 NOTE — Progress Notes (Signed)
Pre visit review using our clinic review tool, if applicable. No additional management support is needed unless otherwise documented below in the visit note. 

## 2014-01-21 NOTE — Progress Notes (Signed)
Subjective:    Patient ID: Wayne Nash, male    DOB: [birth year], 80 y.o.   MRN: [id number]  HPI    Pt has recent rash that itches for about 1 yr(Pt treated by Dr. Larose Kells and dermatologist). Reports rash occurred after no known particular exposure. On review pt does not report any suspicious exposure to soaps, creams, detergents, make up, lotions, detergents, animal exposure exposure, plants or insect bites.  Pt rash is in various area of body on and off. Recently he states better on his arms.  Pt reports no shortness of breath or wheezing. . Pt is not diabetic.  Pt states Dr. Caro Laroche thought maybe benzapril was the cause so the stopped that med but that did not help. He states treated for scabies and did not help. Pt saw derm and he gave triamcinolone cream. This did not help. Pt states no one gave him exact diagnosis. Even dermatologist he saw could not figure out diagnosis per pt report.  Past Medical History  Diagnosis Date  . Hyperlipidemia   . Hypertension     History   Social History  . Marital Status: Divorced    Spouse Name: N/A    Number of Children: 3  . Years of Education: N/A   Occupational History  . 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 Smoking status: Former Smoker    Quit date: 02/22/1994  . Smokeless tobacco: Not on file     Comment: smoked 1953-1996, up to 1.5 ppd  . Alcohol Use: No     Comment:  quit 1985  . Drug Use: No  . Sexual Activity: Not on file   Other Topics Concern  . Not on file   Social History Narrative   7 Gc, 3 GGchildren   lives by himself     Past Surgical History  Procedure Laterality Date  . Colonoscopy  2012     negative  . Appendectomy    . Lumbar disc surgery      Dr Shellia Carwin    Family History  Problem Relation Age of Onset  . Cancer Mother     pancreatic  . Diabetes Neg Hx   . Heart disease Neg Hx   . Stroke Neg Hx     Allergies  Allergen Reactions  . Penicillins      REACTION: SWELLING/HIVES    Current Outpatient Prescriptions on File Prior to Visit  Medication Sig Dispense Refill  . aspirin 81 MG tablet Take 81 mg by mouth daily.    Marland Kitchen atorvastatin (LIPITOR) 20 MG tablet TAKE 1 TABLET EVERY DAY 90 tablet 3  . carvedilol (COREG) 6.25 MG tablet Take 1 tablet (6.25 mg total) by mouth 2 (two) times daily with a meal. 60 tablet 3  . furosemide (LASIX) 20 MG tablet Take 2 tablets (40 mg total) by mouth daily. 60 tablet 1  . HYDROcodone-acetaminophen (NORCO/VICODIN) 5-325 MG per tablet TAKE 1 TABLET BY MOUTH EVERY 6 HOURS AS NEEDED FOR PAIN 30 tablet 0  . permethrin (ELIMITE) 5 % cream Apply 1 application topically once. Apply from neck down , take a shower 8-12 hour later 120 g 1  . triamcinolone lotion (KENALOG) 0.1 % APPLY 1 APPLICATION TOPICALLY 3 (THREE) TIMES DAILY. 60 mL 0  . cyclobenzaprine (FLEXERIL) 5 MG tablet TAKE 1 TABLET BY MOUTH AT BEDTIME AS NEEDED FOR MUSCLE SPASMS/CRAMPS (Patient not taking: Reported on 01/21/2014) 30 tablet 1  No current facility-administered medications on file prior to visit.    BP 155/70 mmHg  Pulse 70  Temp(Src) 97.9 F (36.6 C) (Oral)  Ht 5' 0.5" (1.537 m)  Wt 238 lb 3.2 oz (108.047 kg)  BMI 45.74 kg/m2  SpO2 92%    Review of Systems  Constitutional: Negative for fever, chills and fatigue.  Respiratory: Negative for cough, choking, shortness of breath and wheezing.   Cardiovascular: Negative for chest pain and palpitations.  Skin: Positive for rash.       Scattered on and off rash that itches for 1 yr.       Objective:   Physical Exam  General- No acute distress. Pleasant patient. Neck- Full range of motion, no jvd Lungs- Clear, even and unlabored. Heart- regular rate and rhythm. Neurologic- CNII- XII grossly intact. Skin- moderate rash diffuse over anterior and posterior thorax. Scattered papules and inflamed follicles. Lower back- dry feel and appearance.       Assessment & Plan:

## 2014-01-22 ENCOUNTER — Ambulatory Visit: Payer: Medicare Other | Admitting: Internal Medicine

## 2014-01-24 ENCOUNTER — Telehealth: Payer: Self-pay | Admitting: Internal Medicine

## 2014-01-24 NOTE — Telephone Encounter (Signed)
Caller name:Wayne Nash Relation to GY:IRSW Call back number:[phone] Pharmacy:  Reason for call: pt was seen by Percell Miller on 01/21/14 for a  Rash breakout, pt now states his skin is blood red, has no pain but not sure if its normal, states the rash is worse and it burns and he is very uncomfortable

## 2014-01-24 NOTE — Telephone Encounter (Signed)
Called patient scheduled for appointment for tomorrow. Advised to go to [facility] or [facility] of condition worsens. Patient agreed.

## 2014-01-25 ENCOUNTER — Telehealth: Payer: Self-pay

## 2014-01-25 ENCOUNTER — Other Ambulatory Visit: Payer: Self-pay

## 2014-01-25 ENCOUNTER — Encounter: Payer: Self-pay | Admitting: Medical

## 2014-01-25 ENCOUNTER — Ambulatory Visit (INDEPENDENT_AMBULATORY_CARE_PROVIDER_SITE_OTHER): Payer: Medicare Other | Admitting: Medical

## 2014-01-25 VITALS — BP 137/76 | HR 67 | Temp 97.6°F | Ht 60.5 in | Wt 237.6 lb

## 2014-01-25 DIAGNOSIS — R21 Rash and other nonspecific skin eruption: Secondary | ICD-10-CM

## 2014-01-25 MED ORDER — FLUCONAZOLE 150 MG PO TABS: 150.0000 mg | ORAL_TABLET | Freq: Once | ORAL | Status: DC

## 2014-01-25 NOTE — Assessment & Plan Note (Signed)
Treatment I gave for your rash has not helped. Unfortunately it appears the antibiotic may have helped in formation of possible fungal infection in the inguinal regions. So I am sending diflucan to your pharmacy and use lotrimin or lamisil otc fungal cream twice daily.  Stop the doxycycline and the lac-hydrin. Continue the hydroxyzine and see new dermatologist on Monday.

## 2014-01-25 NOTE — Progress Notes (Signed)
Subjective:    Patient ID: Wayne Nash, male    DOB: 1933/10/14, 80 y.o.   MRN: [id number]  HPI   Pt has rash body similar to last visit. I saw him the other day. This a continuation of his prior history. He has had rash for months as described on last visit. Only change that he has is more bright red rash in both inguinal fold regions. But no itch. Pt has been evaluated by Dr. Larose Kells, dermatologist and myself. The rash still persists. Undetermined diagnosis and failed various treatments.  I gave doxycycline, hydroxyzine and lac-hydrin. Pt thinks that lac-hydrin caused new  Rash in inguinal area. Also lt arm rash slight worse than his baseline.   Pt has appointment with new dermatologist. No sob or wheezing.     Past Medical History  Diagnosis Date  . Hyperlipidemia   . Hypertension     History   Social History  . Marital Status: Divorced    Spouse Name: N/A    Number of Children: 3  . Years of Education: N/A   Occupational History  . 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 Smoking status: Former Smoker    Quit date: 02/22/1994  . Smokeless tobacco: Not on file     Comment: smoked 1953-1996, up to 1.5 ppd  . Alcohol Use: No     Comment:  quit 1985  . Drug Use: No  . Sexual Activity: Not on file   Other Topics Concern  . Not on file   Social History Narrative   7 Gc, 3 GGchildren   lives by himself     Past Surgical History  Procedure Laterality Date  . Colonoscopy  2012     negative  . Appendectomy    . Lumbar disc surgery      Dr Shellia Carwin    Family History  Problem Relation Age of Onset  . Cancer Mother     pancreatic  . Diabetes Neg Hx   . Heart disease Neg Hx   . Stroke Neg Hx     Allergies  Allergen Reactions  . Penicillins     REACTION: SWELLING/HIVES    Current Outpatient Prescriptions on File Prior to Visit  Medication Sig Dispense Refill  . ammonium lactate (LAC-HYDRIN) 12 % cream  Apply topically as needed for dry skin. Apply to skin bid 385 g 0  . aspirin 81 MG tablet Take 81 mg by mouth daily.    Marland Kitchen atorvastatin (LIPITOR) 20 MG tablet TAKE 1 TABLET EVERY DAY 90 tablet 3  . carvedilol (COREG) 6.25 MG tablet Take 1 tablet (6.25 mg total) by mouth 2 (two) times daily with a meal. 60 tablet 3  . cyclobenzaprine (FLEXERIL) 5 MG tablet TAKE 1 TABLET BY MOUTH AT BEDTIME AS NEEDED FOR MUSCLE SPASMS/CRAMPS 30 tablet 1  . doxycycline (VIBRA-TABS) 100 MG tablet Take 1 tablet (100 mg total) by mouth 2 (two) times daily. 14 tablet 0  . furosemide (LASIX) 20 MG tablet Take 2 tablets (40 mg total) by mouth daily. 60 tablet 1  . HYDROcodone-acetaminophen (NORCO/VICODIN) 5-325 MG per tablet TAKE 1 TABLET BY MOUTH EVERY 6 HOURS AS NEEDED FOR PAIN 30 tablet 0  . hydrOXYzine (ATARAX/VISTARIL) 10 MG tablet Take 1 tablet (10 mg total) by mouth 3 (three) times daily as needed for itching. 15 tablet 0  . permethrin (ELIMITE) 5 % cream Apply 1 application topically once. Apply from  neck down , take a shower 8-12 hour later 120 g 1  . triamcinolone lotion (KENALOG) 0.1 % APPLY 1 APPLICATION TOPICALLY 3 (THREE) TIMES DAILY. 60 mL 0   No current facility-administered medications on file prior to visit.    BP 137/76 mmHg  Pulse 67  Temp(Src) 97.6 F (36.4 C) (Oral)  Ht 5' 0.5" (1.537 m)  Wt 237 lb 9.6 oz (107.775 kg)  BMI 45.62 kg/m2  SpO2 93%           Review of Systems  Constitutional: Negative for fever, chills and fatigue.  Respiratory: Negative for cough, choking, shortness of breath and wheezing.   Cardiovascular: Negative for chest pain and palpitations.  Skin: Positive for rash.       Scattered on and off rash that itches for 1 yr. Faint worse on lt arm. But bright red rash in inguinal folds since treatment I gave.       Objective:   Physical Exam   General- No acute distress. Lungs- clear even and unlabored. Heart- RRR Skin- scattered faint pink rash with some dry  skin. Scattered appearance of inflamed follicles on anterior thorac and lower abdomen. Inguinal fold regions bright red appearance bilaterally today. But no warmth or tenderness.          Assessment & Plan:

## 2014-01-25 NOTE — Telephone Encounter (Signed)
Patient presents to the office stating that his rash is worse since he took the medication prescribe. He took the medication at 4pm and presented to our office at 450pm. Per Mackie Pai PA-C, if the patient feels that he is having a reaction he should go to emergency room downstairs. Patient has a derm appt on Monday which we strongly encouraged him to keep. Observed patient who is in no visible distress, no SOB or hives. Patient advised per recommendations and to go to ED if symptoms worsen. Otherwise, keep derm appt on Monday. Patient repeats back directions and verbalizes understanding.

## 2014-01-25 NOTE — Telephone Encounter (Signed)
Pharmacy states they are back ordered on 15omg Fluconazole. Says they do have 100mg  tablets. 2,100mg  tablets  with no refills ordered per ES.

## 2014-01-25 NOTE — Patient Instructions (Addendum)
Treatment I gave for your rash has not helped. Unfortunately it appears the antibiotic may have helped in formation of possible fungal infection in the inguinal regions. So I am sending diflucan to your pharmacy and use lotrimin or lamisil otc fungal cream twice daily.  Stop the doxycycline and the lac-hydrin. Continue the hydroxyzine and see new dermatologist on this coming Monday.

## 2014-01-25 NOTE — Telephone Encounter (Signed)
Patient called from pharmacy,states Fluconazole not at pharmacy. Called in Fluconozole 150mg  1 tablet no refills per ES.

## 2014-01-25 NOTE — Progress Notes (Signed)
Pre visit review using our clinic review tool, if applicable. No additional management support is needed unless otherwise documented below in the visit note. 

## 2014-01-27 ENCOUNTER — Encounter (HOSPITAL_BASED_OUTPATIENT_CLINIC_OR_DEPARTMENT_OTHER): Payer: Self-pay | Admitting: *Deleted

## 2014-01-27 ENCOUNTER — Emergency Department (HOSPITAL_BASED_OUTPATIENT_CLINIC_OR_DEPARTMENT_OTHER)
Admission: EM | Admit: 2014-01-27 | Discharge: 2014-01-27 | Disposition: A | Discharge status: Home / Self Care | Payer: Medicare Other | Attending: Emergency Medicine | Admitting: Emergency Medicine

## 2014-01-27 DIAGNOSIS — L259 Unspecified contact dermatitis, unspecified cause: Secondary | ICD-10-CM | POA: Insufficient documentation

## 2014-01-27 DIAGNOSIS — Z79899 Other long term (current) drug therapy: Secondary | ICD-10-CM | POA: Insufficient documentation

## 2014-01-27 DIAGNOSIS — Z880 Allergy status to penicillin: Secondary | ICD-10-CM | POA: Insufficient documentation

## 2014-01-27 DIAGNOSIS — Z792 Long term (current) use of antibiotics: Secondary | ICD-10-CM | POA: Insufficient documentation

## 2014-01-27 DIAGNOSIS — E785 Hyperlipidemia, unspecified: Secondary | ICD-10-CM | POA: Diagnosis not present

## 2014-01-27 DIAGNOSIS — L309 Dermatitis, unspecified: Secondary | ICD-10-CM | POA: Diagnosis not present

## 2014-01-27 DIAGNOSIS — R21 Rash and other nonspecific skin eruption: Secondary | ICD-10-CM

## 2014-01-27 DIAGNOSIS — Z87891 Personal history of nicotine dependence: Secondary | ICD-10-CM | POA: Insufficient documentation

## 2014-01-27 DIAGNOSIS — Z7952 Long term (current) use of systemic steroids: Secondary | ICD-10-CM | POA: Diagnosis not present

## 2014-01-27 DIAGNOSIS — Z7982 Long term (current) use of aspirin: Secondary | ICD-10-CM | POA: Diagnosis not present

## 2014-01-27 DIAGNOSIS — I10 Essential (primary) hypertension: Secondary | ICD-10-CM | POA: Insufficient documentation

## 2014-01-27 DIAGNOSIS — L42 Pityriasis rosea: Secondary | ICD-10-CM | POA: Diagnosis not present

## 2014-01-27 MED ORDER — PREDNISONE 50 MG PO TABS: 50.0000 mg | ORAL_TABLET | Freq: Every day | ORAL | Status: DC

## 2014-01-27 MED ORDER — FLUCONAZOLE 50 MG PO TABS
150.0000 mg | ORAL_TABLET | Freq: Once | ORAL | Status: AC
  Administered 2014-01-27: 150 mg via ORAL
  Filled 2014-01-27 (×2): qty 1

## 2014-01-27 MED ORDER — PREDNISONE 50 MG PO TABS
60.0000 mg | ORAL_TABLET | Freq: Once | ORAL | Status: AC
  Administered 2014-01-27: 60 mg via ORAL
  Filled 2014-01-27 (×2): qty 1

## 2014-01-27 NOTE — ED Notes (Signed)
Pt c/o rash to chest and abd x 2 weeks

## 2014-01-27 NOTE — ED Provider Notes (Signed)
CSN: [id number]     Arrival date & time 01/27/14  1433 History  This chart was scribed for Wayne Biles, MD by Stephania Fragmin, ED Scribe. This patient was seen in room MH03/MH03 and the patient's care was started at 3:23 PM.    Chief Complaint  Patient presents with  . Rash    The history is provided by the patient. No language interpreter was used.     HPI Comments: Wayne Nash is a 80 y.o. male who presents to the Emergency Department complaining of an itchy rash that initially began 2-3 month ago in his lower abdomen he has been receiving treatment for. The rash is waxing and waning. His doctor was told to come to the ED if his rashes spread all over his body, which began about 2-3 hours ago. He was initially diagnosed with scabies and shingles, which his doctor retracted and instead referred him to a dermatologist with whom he has an appointment tomorrow morning. Patient states that his condition is worse in the torso and perineal region. Patient says that his medication helps somewhat. He denies pain or mouth rashes. No known allergies, new meds.  Past Medical History  Diagnosis Date  . Hyperlipidemia   . Hypertension    Past Surgical History  Procedure Laterality Date  . Colonoscopy  2012     negative  . Appendectomy    . Lumbar disc surgery      Dr Shellia Carwin  . Skin biopsy     Family History  Problem Relation Age of Onset  . Cancer Mother     pancreatic  . Diabetes Neg Hx   . Heart disease Neg Hx   . Stroke Neg Hx    History  Substance Use Topics  . Smoking status: Former Smoker    Quit date: 02/22/1994  . Smokeless tobacco: Not on file     Comment: smoked 1953-1996, up to 1.5 ppd  . Alcohol Use: No     Comment:  quit 1985    Review of Systems  Constitutional: Negative for fever.  Cardiovascular: Negative for chest pain.  Gastrointestinal: Negative for nausea and vomiting.  Skin: Positive for rash.  Allergic/Immunologic: Negative for immunocompromised  state.  Neurological: Negative for weakness.      Allergies  Penicillins  Home Medications   Prior to Admission medications   Medication Sig Start Date End Date Taking? Authorizing Provider  ammonium lactate (LAC-HYDRIN) 12 % cream Apply topically as needed for dry skin. Apply to skin bid 01/21/14   Meriam Sprague Saguier, PA-C  aspirin 81 MG tablet Take 81 mg by mouth daily.    Historical Provider, MD  atorvastatin (LIPITOR) 20 MG tablet TAKE 1 TABLET EVERY DAY 09/13/13   Colon Branch, MD  carvedilol (COREG) 6.25 MG tablet Take 1 tablet (6.25 mg total) by mouth 2 (two) times daily with a meal. 12/03/13   Colon Branch, MD  cyclobenzaprine (FLEXERIL) 5 MG tablet TAKE 1 TABLET BY MOUTH AT BEDTIME AS NEEDED FOR MUSCLE SPASMS/CRAMPS 07/27/13   Colon Branch, MD  doxycycline (VIBRA-TABS) 100 MG tablet Take 1 tablet (100 mg total) by mouth 2 (two) times daily. 01/21/14   Galax, PA-C  fluconazole (DIFLUCAN) 150 MG tablet Take 1 tablet (150 mg total) by mouth once. 01/25/14   Meriam Sprague Saguier, PA-C  furosemide (LASIX) 20 MG tablet Take 2 tablets (40 mg total) by mouth daily. 01/01/14   Colon Branch, MD  HYDROcodone-acetaminophen (NORCO/VICODIN) 5-325 MG  per tablet TAKE 1 TABLET BY MOUTH EVERY 6 HOURS AS NEEDED FOR PAIN 12/11/13   Colon Branch, MD  hydrOXYzine (ATARAX/VISTARIL) 10 MG tablet Take 1 tablet (10 mg total) by mouth 3 (three) times daily as needed for itching. 01/21/14   Meriam Sprague Saguier, PA-C  permethrin (ELIMITE) 5 % cream Apply 1 application topically once. Apply from neck down , take a shower 8-12 hour later 01/01/14   Colon Branch, MD  predniSONE (DELTASONE) 50 MG tablet Take 1 tablet (50 mg total) by mouth daily. 01/27/14   Wayne Biles, MD  triamcinolone lotion (KENALOG) 0.1 % APPLY 1 APPLICATION TOPICALLY 3 (THREE) TIMES DAILY.    Colon Branch, MD   BP 177/60 mmHg  Pulse 75  Temp(Src) 98 F (36.7 C) (Oral)  Resp 18  Ht 5\' 9"  (1.753 m)  Wt 235 lb (106.595 kg)  BMI 34.69 kg/m2  SpO2  95% Physical Exam  Constitutional: He is oriented to person, place, and time. He appears well-developed and well-nourished. No distress.  HENT:  Head: Normocephalic and atraumatic.  No ulcers in the mouth or the nares.   Eyes: Conjunctivae and EOM are normal.  Neck: Neck supple. No tracheal deviation present.  Cardiovascular: Normal rate and regular rhythm.   Pulmonary/Chest: Effort normal and breath sounds normal. No respiratory distress.  Musculoskeletal: Normal range of motion.  Neurological: He is alert and oriented to person, place, and time.  Skin: Skin is warm and dry.  Torso and upper extremity erythematous macule papular rash with blanching. Patient also has an erythematous patch in the upper extremity and upper part of the chest. The rash extends down bilaterally to the upper and lower extremity but appears to be worse in the torso.     Psychiatric: He has a normal mood and affect. His behavior is normal.  Nursing note and vitals reviewed.       ED Course  Procedures (including critical care time)  DIAGNOSTIC STUDIES: Oxygen Saturation is 95% on room air, adequate by my interpretation.    COORDINATION OF CARE: 3:30 PM - Discussed treatment plan with pt at bedside which includes Prednisone and Diflucan and pt agreed to plan.   Labs Review Labs Reviewed - No data to display  Imaging Review No results found.   EKG Interpretation None       MDM   Final diagnoses:  Rash, skin  Dermatitis    I personally performed the services described in this documentation, which was scribed in my presence. The recorded information has been reviewed and is accurate.   Pt comes in with cc of rash. Erythematous, macular and papular lesion, with a patch, waxing and waning, worse in the torso and around the perineum.  DDX: pitiaris rosea is high on the ddx.  There are no systemic signs with the rash, no oral involvement. Stable for d.c  - with Derm appt  tomorrow.   Wayne Biles, MD 01/27/14 [phone]

## 2014-01-27 NOTE — Discharge Instructions (Signed)
We suspect that the rash is not bacterial in nature at this time, and thus no antibiotics are indicated. Please take the meds prescribed. See the dermatologist as requested. Keep the area clean and dry.   Rash A rash is a change in the color or texture of your skin. There are many different types of rashes. You may have other problems that accompany your rash. CAUSES   Infections.  Allergic reactions. This can include allergies to pets or foods.  Certain medicines.  Exposure to certain chemicals, soaps, or cosmetics.  Heat.  Exposure to poisonous plants.  Tumors, both cancerous and noncancerous. SYMPTOMS   Redness.  Scaly skin.  Itchy skin.  Dry or cracked skin.  Bumps.  Blisters.  Pain. DIAGNOSIS  Your caregiver may do a physical exam to determine what type of rash you have. A skin sample (biopsy) may be taken and examined under a microscope. TREATMENT  Treatment depends on the type of rash you have. Your caregiver may prescribe certain medicines. For serious conditions, you may need to see a skin doctor (dermatologist). HOME CARE INSTRUCTIONS   Avoid the substance that caused your rash.  Do not scratch your rash. This can cause infection.  You may take cool baths to help stop itching.  Only take over-the-counter or prescription medicines as directed by your caregiver.  Keep all follow-up appointments as directed by your caregiver. SEEK IMMEDIATE MEDICAL CARE IF:  You have increasing pain, swelling, or redness.  You have a fever.  You have new or severe symptoms.  You have body aches, diarrhea, or vomiting.  Your rash is not better after 3 days. MAKE SURE YOU:  Understand these instructions.  Will watch your condition.  Will get help right away if you are not doing well or get worse. Document Released: 01/29/2002 Document Revised: 05/03/2011 Document Reviewed: 11/23/2010 [facility] Patient Information 2015 [place], [place]. This information is not  intended to replace advice given to you by your health care provider. Make sure you discuss any questions you have with your health care provider.

## 2014-01-28 DIAGNOSIS — B86 Scabies: Secondary | ICD-10-CM | POA: Diagnosis not present

## 2014-02-22 DIAGNOSIS — L03111 Cellulitis of right axilla: Secondary | ICD-10-CM | POA: Diagnosis not present

## 2014-02-22 DIAGNOSIS — W5501XA Bitten by cat, initial encounter: Secondary | ICD-10-CM | POA: Diagnosis not present

## 2014-02-28 DIAGNOSIS — B86 Scabies: Secondary | ICD-10-CM | POA: Diagnosis not present

## 2014-03-22 ENCOUNTER — Telehealth: Payer: Self-pay | Admitting: Internal Medicine

## 2014-03-22 MED ORDER — HYDROCODONE-ACETAMINOPHEN 5-325 MG PO TABS: ORAL_TABLET | ORAL | Status: DC

## 2014-03-22 NOTE — Telephone Encounter (Signed)
done

## 2014-03-22 NOTE — Telephone Encounter (Signed)
Pt is requesting refill on Hydrocodone.  Last OV: 01/01/2014 Last Fill: 12/11/2013 # [id number] UDS: 12/13/2012 Low risk  Please advise.

## 2014-03-22 NOTE — Telephone Encounter (Signed)
Caller name: keelon Relation to pt: self Call back number: [phone] Pharmacy:  Reason for call:   Patient requesting a new hydrocodone rx.

## 2014-03-22 NOTE — Telephone Encounter (Signed)
Tried contacting Pt to inform Pt, Rx is ready for pick up at front desk. Was unable to contact Pt.

## 2014-04-11 ENCOUNTER — Ambulatory Visit (INDEPENDENT_AMBULATORY_CARE_PROVIDER_SITE_OTHER): Payer: PPO | Admitting: Medical

## 2014-04-11 VITALS — BP 147/72 | HR 66 | Temp 98.2°F | Ht 60.5 in | Wt 238.6 lb

## 2014-04-11 DIAGNOSIS — R600 Localized edema: Secondary | ICD-10-CM

## 2014-04-11 DIAGNOSIS — R0600 Dyspnea, unspecified: Secondary | ICD-10-CM

## 2014-04-11 DIAGNOSIS — M79606 Pain in leg, unspecified: Secondary | ICD-10-CM

## 2014-04-11 DIAGNOSIS — I10 Essential (primary) hypertension: Secondary | ICD-10-CM

## 2014-04-11 DIAGNOSIS — R609 Edema, unspecified: Secondary | ICD-10-CM

## 2014-04-11 DIAGNOSIS — M79604 Pain in right leg: Secondary | ICD-10-CM

## 2014-04-11 DIAGNOSIS — M79605 Pain in left leg: Secondary | ICD-10-CM

## 2014-04-11 DIAGNOSIS — J449 Chronic obstructive pulmonary disease, unspecified: Secondary | ICD-10-CM | POA: Insufficient documentation

## 2014-04-11 NOTE — Assessment & Plan Note (Addendum)
Some when wallking short distances. Ex walking into our office. Some mildly low O2% laying supine. Hx of smoking. Evaluate for chf and r/o dvt first. On follow up may give inhalers/or may send in rx.

## 2014-04-11 NOTE — Assessment & Plan Note (Signed)
Pt states on carvedilol and lasix. Pt bp is borderline.

## 2014-04-11 NOTE — Assessment & Plan Note (Addendum)
Evalute if some chf by cxr and r/o doppler. If both negative then continue lasix and elevate legs. Use ted hose compression stockings.(Rx given today).

## 2014-04-11 NOTE — Progress Notes (Signed)
Subjective:    Patient ID: Wayne Nash, male    DOB: October 16, 1933, 81 y.o.   MRN: [id number]  HPI   Pt in with bilateral leg swelling  for a couple of weeks. Pt legs area more swollen by end of the day. Pt weight is table. He has not gained weight on flow sheet. No sob. No pain. But his legs feeling swollen is aggravated feeling. On review no popliteal pain. No sob or wheezing. No chest pain.   No orthopnea on lying supine.  Pt has some htn history(Today is bp is borderline). Pt has no cardiac or neurologic signs or symptoms.  Pt wants placard to park in handicapped space. He describes difficult walking majority of the time. He uses a walker. Also states he gets easily winded.   Review of Systems  Constitutional: Negative for fever, chills and fatigue.  Respiratory: Negative for chest tightness, shortness of breath and wheezing.   Cardiovascular: Negative for chest pain and palpitations.  Gastrointestinal: Negative for abdominal pain, constipation and blood in stool.  Musculoskeletal: Negative for back pain.  Neurological: Negative for dizziness, seizures, syncope, speech difficulty, weakness, light-headedness, numbness and headaches.  Hematological: Negative for adenopathy. Does not bruise/bleed easily.  Psychiatric/Behavioral: Negative for behavioral problems and confusion.   Past Medical History  Diagnosis Date  . Hyperlipidemia   . Hypertension     History   Social History  . Marital Status: Divorced    Spouse Name: N/A  . Number of Children: 3  . Years of Education: N/A   Occupational History  . 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 Smoking status: Former Smoker    Quit date: 02/22/1994  . Smokeless tobacco: Not on file     Comment: smoked 1953-1996, up to 1.5 ppd  . Alcohol Use: No     Comment:  quit 1985  . Drug Use: No  . Sexual Activity: Not on file   Other Topics Concern  . Not on file   Social  History Narrative   7 Gc, 3 GGchildren   lives by himself     Past Surgical History  Procedure Laterality Date  . Colonoscopy  2012     negative  . Appendectomy    . Lumbar disc surgery      Dr Shellia Carwin  . Skin biopsy      Family History  Problem Relation Age of Onset  . Cancer Mother     pancreatic  . Diabetes Neg Hx   . Heart disease Neg Hx   . Stroke Neg Hx     Allergies  Allergen Reactions  . Penicillins     REACTION: SWELLING/HIVES    Current Outpatient Prescriptions on File Prior to Visit  Medication Sig Dispense Refill  . ammonium lactate (LAC-HYDRIN) 12 % cream Apply topically as needed for dry skin. Apply to skin bid 385 g 0  . aspirin 81 MG tablet Take 81 mg by mouth daily.    Marland Kitchen atorvastatin (LIPITOR) 20 MG tablet TAKE 1 TABLET EVERY DAY 90 tablet 3  . carvedilol (COREG) 6.25 MG tablet Take 1 tablet (6.25 mg total) by mouth 2 (two) times daily with a meal. 60 tablet 3  . cyclobenzaprine (FLEXERIL) 5 MG tablet TAKE 1 TABLET BY MOUTH AT BEDTIME AS NEEDED FOR MUSCLE SPASMS/CRAMPS 30 tablet 1  . doxycycline (VIBRA-TABS) 100 MG tablet Take 1 tablet (100 mg total) by mouth 2 (two) times  daily. 14 tablet 0  . fluconazole (DIFLUCAN) 150 MG tablet Take 1 tablet (150 mg total) by mouth once. 1 tablet 0  . furosemide (LASIX) 20 MG tablet Take 2 tablets (40 mg total) by mouth daily. 60 tablet 1  . HYDROcodone-acetaminophen (NORCO/VICODIN) 5-325 MG per tablet TAKE 1 TABLET BY MOUTH EVERY 6 HOURS AS NEEDED FOR PAIN 60 tablet 0  . hydrOXYzine (ATARAX/VISTARIL) 10 MG tablet Take 1 tablet (10 mg total) by mouth 3 (three) times daily as needed for itching. 15 tablet 0  . permethrin (ELIMITE) 5 % cream Apply 1 application topically once. Apply from neck down , take a shower 8-12 hour later 120 g 1  . predniSONE (DELTASONE) 50 MG tablet Take 1 tablet (50 mg total) by mouth daily. 5 tablet 0  . triamcinolone lotion (KENALOG) 0.1 % APPLY 1 APPLICATION TOPICALLY 3 (THREE) TIMES DAILY.  60 mL 0   No current facility-administered medications on file prior to visit.    BP 147/72 mmHg  Pulse 66  Temp(Src) 98.2 F (36.8 C) (Oral)  Ht 5' 0.5" (1.537 m)  Wt 238 lb 9.6 oz (108.228 kg)  BMI 45.81 kg/m2  SpO2 93%       Objective:   Physical Exam   General Mental Status- Alert. General Appearance- Not in acute distress.   Skin General: Color- Normal Color. Moisture- Normal Moisture.  Neck Carotid Arteries- Normal color. Moisture- Normal Moisture. No carotid bruits. No JVD.  Chest and Lung Exam Auscultation: Breath Sounds:-Normal. Even and unlabored. Pt o2 sat 93-90% laying supine.  Cardiovascular Auscultation:Rythm- Regular. Murmurs & Other Heart Sounds:Auscultation of the heart reveals- No Murmurs.  Abdomen Inspection:-Inspeection Normal. Palpation/Percussion:Note:No mass. Palpation and Percussion of the abdomen reveal- Non Tender, Non Distended + BS, no rebound or guarding.    Neurologic Cranial Nerve exam:- CN III-XII intact(No nystagmus), symmetric smile. Drift Test:- No drift. Romberg Exam:- Negative.  Heal to Toe Gait exam:-Normal. Finger to Nose:- Normal/Intact Strength:- 5/5 equal and symmetric strength both upper and lower extremities.  Lower ext- bilateral 1-2+ pedal edema. Negative homans signs.     Assessment & Plan:

## 2014-04-11 NOTE — Patient Instructions (Addendum)
Dyspnea Some when wallking short distances. Ex walking into our office. Some mildly low O2% laying supine. Hx of smoking.   HTN (hypertension) Pt states on carvedilol and lasix. Pt bp is borderline.   Edema Evalute if some chf by cxr and r/o doppler. If both negative then continue lasix and elevate legs. Use ted hose compression stockings.(Rx given today).    Follow up 8 days or as needed.  If he brings me the dmv placard form then I will sign.

## 2014-04-11 NOTE — Progress Notes (Signed)
Pre visit review using our clinic review tool, if applicable. No additional management support is needed unless otherwise documented below in the visit note. 

## 2014-04-12 ENCOUNTER — Ambulatory Visit (HOSPITAL_BASED_OUTPATIENT_CLINIC_OR_DEPARTMENT_OTHER)
Admission: RE | Admit: 2014-04-12 | Discharge: 2014-04-12 | Disposition: A | Discharge status: Home / Self Care | Payer: PPO | Source: Ambulatory Visit | Attending: Medical | Admitting: Medical

## 2014-04-12 DIAGNOSIS — R609 Edema, unspecified: Secondary | ICD-10-CM | POA: Insufficient documentation

## 2014-04-12 DIAGNOSIS — M79605 Pain in left leg: Secondary | ICD-10-CM | POA: Insufficient documentation

## 2014-04-12 DIAGNOSIS — R600 Localized edema: Secondary | ICD-10-CM

## 2014-04-12 DIAGNOSIS — R0600 Dyspnea, unspecified: Secondary | ICD-10-CM

## 2014-04-12 DIAGNOSIS — M79604 Pain in right leg: Secondary | ICD-10-CM | POA: Insufficient documentation

## 2014-04-12 DIAGNOSIS — M79606 Pain in leg, unspecified: Secondary | ICD-10-CM

## 2014-04-15 ENCOUNTER — Telehealth: Payer: Self-pay | Admitting: Medical

## 2014-04-15 ENCOUNTER — Telehealth: Payer: Self-pay | Admitting: *Deleted

## 2014-04-15 NOTE — Telephone Encounter (Signed)
Filled out his dmv parking decal. I filled for lifetime. He has difficulty ambulating. Uses cane. I don't foresee him improving. Therefore reasonable for him to have lifetime decal. He picked up form today. Also gave him rx for stocking with compression grade recommended.

## 2014-04-15 NOTE — Telephone Encounter (Signed)
Patient dropped off handicap placard application. Forwarded to [employer]. JG//CMA

## 2014-04-17 NOTE — Telephone Encounter (Signed)
A copy of form also sent for scanning. JG//CMA

## 2014-04-17 NOTE — Telephone Encounter (Signed)
Form placed up front for patient to pick up. Gwen called and left message informing patient. JG//CMA

## 2014-04-19 ENCOUNTER — Ambulatory Visit (INDEPENDENT_AMBULATORY_CARE_PROVIDER_SITE_OTHER): Payer: PPO | Admitting: Medical

## 2014-04-19 ENCOUNTER — Encounter: Payer: Self-pay | Admitting: Medical

## 2014-04-19 VITALS — BP 145/70 | HR 64 | Temp 97.7°F | Ht 60.05 in | Wt 237.4 lb

## 2014-04-19 DIAGNOSIS — I10 Essential (primary) hypertension: Secondary | ICD-10-CM

## 2014-04-19 DIAGNOSIS — R0600 Dyspnea, unspecified: Secondary | ICD-10-CM

## 2014-04-19 DIAGNOSIS — M549 Dorsalgia, unspecified: Secondary | ICD-10-CM | POA: Insufficient documentation

## 2014-04-19 DIAGNOSIS — M545 Low back pain: Secondary | ICD-10-CM

## 2014-04-19 DIAGNOSIS — R609 Edema, unspecified: Secondary | ICD-10-CM

## 2014-04-19 LAB — COMPREHENSIVE METABOLIC PANEL
ALBUMIN: 4 g/dL (ref 3.5–5.2)
ALK PHOS: 110 U/L (ref 39–117)
ALT: 10 U/L (ref 0–53)
AST: 13 U/L (ref 0–37)
BUN: 10 mg/dL (ref 6–23)
CO2: 38 mEq/L — ABNORMAL HIGH (ref 19–32)
Calcium: 9.8 mg/dL (ref 8.4–10.5)
Chloride: 100 mEq/L (ref 96–112)
Creatinine, Ser: 0.61 mg/dL (ref 0.40–1.50)
GFR: 135.64 mL/min (ref >60.00)
Glucose, Bld: 80 mg/dL (ref 70–99)
Potassium: 4.4 mEq/L (ref 3.5–5.1)
Sodium: 142 mEq/L (ref 135–145)
Total Bilirubin: 0.6 mg/dL (ref 0.2–1.2)
Total Protein: 6.6 g/dL (ref 6.0–8.3)

## 2014-04-19 MED ORDER — HYDROCODONE-ACETAMINOPHEN 5-325 MG PO TABS: 1.0000 | ORAL_TABLET | Freq: Four times a day (QID) | ORAL | Status: DC | PRN

## 2014-04-19 MED ORDER — BUDESONIDE-FORMOTEROL FUMARATE 160-4.5 MCG/ACT IN AERO: 2.0000 | INHALATION_SPRAY | Freq: Two times a day (BID) | RESPIRATORY_TRACT | Status: DC

## 2014-04-19 NOTE — Assessment & Plan Note (Signed)
NO radicular pain but high level at times and chronic for years per pt. Will rx norco quantity for one month only. If he needs more will need contract and uds.

## 2014-04-19 NOTE — Progress Notes (Signed)
Subjective:    Patient ID: Wayne Nash, male    DOB: 11/09/1933, 81 y.o.   MRN: [id number]  HPI   Pt in for follow up. He had pedal edema complaints. Neg cxr for chf and negative dopplers. Pt does have rx for compression house stockings. He has not gotten yet since will cost about $100.    Pt bp is elevated today. More than last time. No cardiac or neurologic signs or symptoms. Pt is on carvedilol and lasix.  He is not reporting sob today.  I filled out pt dmv placard since last visit. He has mobility problems.  At end of exam he added if I could refill his hydorcodone. He has chronic low back pain for years. Previously filled by Dr. Larose Kells. Prior surgery to his back. Take at most twice daily. Pain level moderate to severe. No report of any leg weakness, numbness or incontinemce.    Review of Systems  Constitutional: Negative for fever, chills, diaphoresis, activity change and fatigue.  Respiratory: Negative for cough, chest tightness and shortness of breath.   Cardiovascular: Negative for chest pain, palpitations and leg swelling.  Gastrointestinal: Negative for nausea, vomiting and abdominal pain.  Musculoskeletal: Negative for neck pain and neck stiffness.  Neurological: Negative for dizziness, tremors, seizures, syncope, facial asymmetry, speech difficulty, weakness, light-headedness, numbness and headaches.  Psychiatric/Behavioral: Negative for behavioral problems, confusion and agitation. The patient is not nervous/anxious.    Past Medical History  Diagnosis Date  . Hyperlipidemia   . Hypertension     History   Social History  . Marital Status: Divorced    Spouse Name: N/A  . Number of Children: 3  . Years of Education: N/A   Occupational History  . 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 Smoking status: Former Smoker    Quit date: 02/22/1994  . Smokeless tobacco: Not on file     Comment: smoked 1953-1996, up to  1.5 ppd  . Alcohol Use: No     Comment:  quit 1985  . Drug Use: No  . Sexual Activity: Not on file   Other Topics Concern  . Not on file   Social History Narrative   7 Gc, 3 GGchildren   lives by himself     Past Surgical History  Procedure Laterality Date  . Colonoscopy  2012     negative  . Appendectomy    . Lumbar disc surgery      Dr Shellia Carwin  . Skin biopsy      Family History  Problem Relation Age of Onset  . Cancer Mother     pancreatic  . Diabetes Neg Hx   . Heart disease Neg Hx   . Stroke Neg Hx     Allergies  Allergen Reactions  . Penicillins     REACTION: SWELLING/HIVES    Current Outpatient Prescriptions on File Prior to Visit  Medication Sig Dispense Refill  . ammonium lactate (LAC-HYDRIN) 12 % cream Apply topically as needed for dry skin. Apply to skin bid 385 g 0  . aspirin 81 MG tablet Take 81 mg by mouth daily.    Marland Kitchen atorvastatin (LIPITOR) 20 MG tablet TAKE 1 TABLET EVERY DAY 90 tablet 3  . carvedilol (COREG) 6.25 MG tablet Take 1 tablet (6.25 mg total) by mouth 2 (two) times daily with a meal. 60 tablet 3  . cyclobenzaprine (FLEXERIL) 5 MG tablet TAKE 1 TABLET BY MOUTH  AT BEDTIME AS NEEDED FOR MUSCLE SPASMS/CRAMPS 30 tablet 1  . doxycycline (VIBRA-TABS) 100 MG tablet Take 1 tablet (100 mg total) by mouth 2 (two) times daily. 14 tablet 0  . fluconazole (DIFLUCAN) 150 MG tablet Take 1 tablet (150 mg total) by mouth once. 1 tablet 0  . furosemide (LASIX) 20 MG tablet Take 2 tablets (40 mg total) by mouth daily. 60 tablet 1  . HYDROcodone-acetaminophen (NORCO/VICODIN) 5-325 MG per tablet TAKE 1 TABLET BY MOUTH EVERY 6 HOURS AS NEEDED FOR PAIN 60 tablet 0  . hydrOXYzine (ATARAX/VISTARIL) 10 MG tablet Take 1 tablet (10 mg total) by mouth 3 (three) times daily as needed for itching. 15 tablet 0  . permethrin (ELIMITE) 5 % cream Apply 1 application topically once. Apply from neck down , take a shower 8-12 hour later 120 g 1  . predniSONE (DELTASONE) 50 MG  tablet Take 1 tablet (50 mg total) by mouth daily. 5 tablet 0  . triamcinolone lotion (KENALOG) 0.1 % APPLY 1 APPLICATION TOPICALLY 3 (THREE) TIMES DAILY. 60 mL 0   No current facility-administered medications on file prior to visit.    BP 145/70 mmHg  Pulse 64  Temp(Src) 97.7 F (36.5 C) (Oral)  Ht 5' 0.05" (1.525 m)  Wt 237 lb 6.4 oz (107.684 kg)  BMI 46.30 kg/m2  SpO2 91%      Objective:   Physical Exam   General Mental Status- Alert. General Appearance- Not in acute distress.   Skin General: Color- Normal Color. Moisture- Normal Moisture.  Neck Carotid Arteries- Normal color. Moisture- Normal Moisture. No carotid bruits. No JVD.  Chest and Lung Exam Auscultation: Breath Sounds:-Normal.  Cardiovascular Auscultation:Rythm- Regular. Murmurs & Other Heart Sounds:Auscultation of the heart reveals- No Murmurs.  Abdomen Inspection:-Inspeection Normal. Palpation/Percussion:Note:No mass. Palpation and Percussion of the abdomen reveal- Non Tender, Non Distended + BS, no rebound or guarding.    Neurologic Cranial Nerve exam:- CN III-XII intact(No nystagmus), symmetric smile. Drift Test:- No drift. Romberg Exam:- Negative.  Heal to Toe Gait exam:-Normal. Finger to Nose:- Normal/Intact Strength:- 5/5 equal and symmetric strength both upper and lower extremities.  Lower ext- some pedal edema still. About same degree as last visit.    Assessment & Plan:

## 2014-04-19 NOTE — Assessment & Plan Note (Addendum)
Dependent edema. Normal cxr. Negative dopplers. Elevate legs daily. Get compression stockings.

## 2014-04-19 NOTE — Patient Instructions (Addendum)
HTN (hypertension) Advised pt to check his bp 2-3 times a week. If bp over 140/90 then add extra lasix daily. At that point would be on 60 mg lasix. Continue to eat banana daily. Continue carvedilol. Get cmp today.   Edema Dependent edema. Normal cxr. Negative dopplers. Elevate legs daily. Get compression stockings.   Back pain NO radicular pain but high level at times and chronic for years per pt. Will rx norco quantity for one month only. If he needs more will need contract and uds.   Dyspnea Former smoker. And his 02 sat is low 91% today.93% last time. Will rx trial of symbicort. May have some copd.     Follow up in one month or as needed.

## 2014-04-19 NOTE — Assessment & Plan Note (Addendum)
Former smoker. And his 02 sat is low 91% today.93% last time. Will rx trial of symbicort. May have some copd.

## 2014-04-19 NOTE — Assessment & Plan Note (Signed)
Advised pt to check his bp 2-3 times a week. If bp over 140/90 then add extra lasix daily. At that point would be on 60 mg lasix. Continue to eat banana daily. Continue carvedilol. Get cmp today.

## 2014-04-19 NOTE — Progress Notes (Signed)
Pre visit review using our clinic review tool, if applicable. No additional management support is needed unless otherwise documented below in the visit note. 

## 2014-04-24 ENCOUNTER — Other Ambulatory Visit: Payer: Self-pay | Admitting: Internal Medicine

## 2014-05-16 ENCOUNTER — Other Ambulatory Visit: Payer: Self-pay

## 2014-05-20 ENCOUNTER — Encounter: Payer: Self-pay | Admitting: Internal Medicine

## 2014-05-20 ENCOUNTER — Ambulatory Visit (INDEPENDENT_AMBULATORY_CARE_PROVIDER_SITE_OTHER): Payer: PPO | Admitting: Internal Medicine

## 2014-05-20 VITALS — BP 130/68 | HR 63 | Temp 98.0°F | Ht 60.0 in | Wt 233.2 lb

## 2014-05-20 DIAGNOSIS — R609 Edema, unspecified: Secondary | ICD-10-CM

## 2014-05-20 DIAGNOSIS — R21 Rash and other nonspecific skin eruption: Secondary | ICD-10-CM

## 2014-05-20 DIAGNOSIS — R0600 Dyspnea, unspecified: Secondary | ICD-10-CM | POA: Diagnosis not present

## 2014-05-20 DIAGNOSIS — I10 Essential (primary) hypertension: Secondary | ICD-10-CM | POA: Diagnosis not present

## 2014-05-20 DIAGNOSIS — R269 Unspecified abnormalities of gait and mobility: Secondary | ICD-10-CM

## 2014-05-20 MED ORDER — FUROSEMIDE 20 MG PO TABS: 40.0000 mg | ORAL_TABLET | Freq: Every day | ORAL | Status: DC

## 2014-05-20 MED ORDER — ATORVASTATIN CALCIUM 20 MG PO TABS: 20.0000 mg | ORAL_TABLET | Freq: Every day | ORAL | Status: DC

## 2014-05-20 MED ORDER — TRIAMCINOLONE ACETONIDE 0.1 % EX LOTN: TOPICAL_LOTION | Freq: Three times a day (TID) | CUTANEOUS | Status: DC

## 2014-05-20 NOTE — Assessment & Plan Note (Signed)
hypertension, well controlled

## 2014-05-20 NOTE — Assessment & Plan Note (Addendum)
Rash resolved , using prn triamcinolone-Lac-Hydrin but mostly for dry skin

## 2014-05-20 NOTE — Progress Notes (Signed)
Pre visit review using our clinic review tool, if applicable. No additional management support is needed unless otherwise documented below in the visit note. 

## 2014-05-20 NOTE — Assessment & Plan Note (Signed)
Edema, stable, patient is still quite concerned about it, previous w/u neg. For now recommend leg elevation, compression stockings.

## 2014-05-20 NOTE — Assessment & Plan Note (Signed)
At the last visit, there was a concern about COPD, patient is currently asymptomatic, he is a former smoker. Never try Symbicort. Plan: Observation for now, consider PFTs

## 2014-05-20 NOTE — Patient Instructions (Signed)
Leg elevation 1 hour twice a day  Low salt diet  Compression stockings  Come back in 2 months for a physical exam, fasting

## 2014-05-20 NOTE — Progress Notes (Signed)
Subjective:    Patient ID: Wayne Nash, male    DOB: [birth year], 81 y.o.   MRN: [id number]  DOS:  05/20/2014 Type of visit - description : Follow-up Interval history: Edema about the same to the patient, still there, not using stockings. On further questioning, states that the real problem with his legs is not feeling steady or secure when he walks. Hypertension, good medication compliance, BP today is very good. Rash: Resolved Question of COPD, see last office visit, did not try Symbicort, review of systems negative   Review of Systems Denies chest pain, palpitations. Able to do all his activities of daily living without difficulty breathing No cough, sputum production or wheezing.  Past Medical History  Diagnosis Date  . Hyperlipidemia   . Hypertension     Past Surgical History  Procedure Laterality Date  . Colonoscopy  2012     negative  . Appendectomy    . Lumbar disc surgery      Dr Shellia Carwin  . Skin biopsy      History   Social History  . Marital Status: Divorced    Spouse Name: N/A  . Number of Children: 3  . Years of Education: N/A   Occupational History  . 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 Smoking status: Former Smoker    Quit date: 02/22/1994  . Smokeless tobacco: Not on file     Comment: smoked 1953-1996, up to 1.5 ppd  . Alcohol Use: No     Comment:  quit 1985  . Drug Use: No  . Sexual Activity: Not on file   Other Topics Concern  . Not on file   Social History Narrative   7 Gc, 3 GGchildren   lives by himself         Medication List       This list is accurate as of: 05/20/14  7:42 PM.  Always use your most recent med list.               ammonium lactate 12 % cream  Commonly known as:  LAC-HYDRIN  Apply topically as needed for dry skin. Apply to skin bid     aspirin 81 MG tablet  Take 81 mg by mouth daily.     atorvastatin 20 MG tablet  Commonly known as:  LIPITOR    TAKE 1 TABLET EVERY DAY     carvedilol 6.25 MG tablet  Commonly known as:  COREG  Take 1 tablet (6.25 mg total) by mouth 2 (two) times daily with a meal.     cyclobenzaprine 5 MG tablet  Commonly known as:  FLEXERIL  TAKE 1 TABLET BY MOUTH AT BEDTIME AS NEEDED FOR MUSCLE SPASMS/CRAMPS     furosemide 20 MG tablet  Commonly known as:  LASIX  TAKE 2 TABLETS BY MOUTH ONCE DAILY     HYDROcodone-acetaminophen 5-325 MG per tablet  Commonly known as:  NORCO  Take 1 tablet by mouth every 6 (six) hours as needed for moderate pain.     triamcinolone lotion 0.1 %  Commonly known as:  KENALOG  APPLY 1 APPLICATION TOPICALLY 3 (THREE) TIMES DAILY.           Objective:   Physical Exam BP 130/68 mmHg  Pulse 63  Temp(Src) 98 F (36.7 C) (Oral)  Ht 5' (1.524 m)  Wt 233 lb 4 oz (105.802 kg)  BMI 45.55 kg/m2  SpO2 97%  General:   Well developed, well nourished . NAD.  HEENT:  Normocephalic . Face symmetric, atraumatic Lungs:  Slightly decreased breath sounds but clear Heart: RRR,  no murmur.  Muscle skeletal: +/+++ pretibial edema bilaterally  Skin: Not pale. Not jaundice Neurologic:  alert & oriented X3.  Speech normal, gait appropriate for age but limited w/ mild limp- left leg, a chronic finding according to the patient DTRs symmetric except for absent right knee jerk  Psych--  Cognition and judgment appear intact.  Cooperative with normal attention span and concentration.  Behavior appropriate. No anxious or depressed appearing.       Assessment & Plan:   Today , I spent more than  25  min with the patient: >50% of the time counseling regards edema , pt still concerned about id, tried to reassure him and discussed leg elevation-stockings use rather than increase meds and create other problems

## 2014-05-20 NOTE — Assessment & Plan Note (Signed)
Gait disorder, The patient feels quite insecure walking, uses a cane, d/t falls in the past that he is nervous about more falls. Recommend PT referral, cont using a cane

## 2014-06-11 ENCOUNTER — Emergency Department (HOSPITAL_BASED_OUTPATIENT_CLINIC_OR_DEPARTMENT_OTHER): Payer: PPO

## 2014-06-11 ENCOUNTER — Ambulatory Visit (INDEPENDENT_AMBULATORY_CARE_PROVIDER_SITE_OTHER): Payer: PPO | Admitting: Medical

## 2014-06-11 ENCOUNTER — Encounter: Payer: Self-pay | Admitting: Medical

## 2014-06-11 ENCOUNTER — Emergency Department (HOSPITAL_BASED_OUTPATIENT_CLINIC_OR_DEPARTMENT_OTHER)
Admission: EM | Admit: 2014-06-11 | Discharge: 2014-06-11 | Disposition: A | Discharge status: Other Acute Inpatient Hospital | Payer: PPO | Attending: Emergency Medicine | Admitting: Emergency Medicine

## 2014-06-11 ENCOUNTER — Encounter (HOSPITAL_BASED_OUTPATIENT_CLINIC_OR_DEPARTMENT_OTHER): Payer: Self-pay | Admitting: *Deleted

## 2014-06-11 VITALS — BP 156/67 | HR 81 | Temp 98.4°F | Ht 60.5 in

## 2014-06-11 DIAGNOSIS — Z880 Allergy status to penicillin: Secondary | ICD-10-CM | POA: Insufficient documentation

## 2014-06-11 DIAGNOSIS — Z7982 Long term (current) use of aspirin: Secondary | ICD-10-CM | POA: Insufficient documentation

## 2014-06-11 DIAGNOSIS — Z79899 Other long term (current) drug therapy: Secondary | ICD-10-CM | POA: Diagnosis not present

## 2014-06-11 DIAGNOSIS — R0902 Hypoxemia: Secondary | ICD-10-CM | POA: Diagnosis not present

## 2014-06-11 DIAGNOSIS — R42 Dizziness and giddiness: Secondary | ICD-10-CM

## 2014-06-11 DIAGNOSIS — E785 Hyperlipidemia, unspecified: Secondary | ICD-10-CM | POA: Diagnosis not present

## 2014-06-11 DIAGNOSIS — I10 Essential (primary) hypertension: Secondary | ICD-10-CM | POA: Diagnosis not present

## 2014-06-11 DIAGNOSIS — J9600 Acute respiratory failure, unspecified whether with hypoxia or hypercapnia: Secondary | ICD-10-CM | POA: Insufficient documentation

## 2014-06-11 DIAGNOSIS — R911 Solitary pulmonary nodule: Secondary | ICD-10-CM | POA: Diagnosis not present

## 2014-06-11 LAB — COMPREHENSIVE METABOLIC PANEL
ALT: 9 U/L (ref 0–53)
ANION GAP: 5 (ref 5–15)
AST: 12 U/L (ref 0–37)
Albumin: 3.5 g/dL (ref 3.5–5.2)
Alkaline Phosphatase: 83 U/L (ref 39–117)
BILIRUBIN TOTAL: 1 mg/dL (ref 0.3–1.2)
BUN: 15 mg/dL (ref 6–23)
CHLORIDE: 99 mmol/L (ref 96–112)
CO2: 35 mmol/L — ABNORMAL HIGH (ref 19–32)
Calcium: 9.2 mg/dL (ref 8.4–10.5)
Creatinine, Ser: 0.61 mg/dL (ref 0.50–1.35)
GFR calc Af Amer: >90 mL/min (ref >90)
GFR calc non Af Amer: >90 mL/min (ref >90)
Glucose, Bld: 136 mg/dL — ABNORMAL HIGH (ref 70–99)
Potassium: 3.2 mmol/L — ABNORMAL LOW (ref 3.5–5.1)
Sodium: 139 mmol/L (ref 135–145)
TOTAL PROTEIN: 6.3 g/dL (ref 6.0–8.3)

## 2014-06-11 LAB — CBC WITH DIFFERENTIAL/PLATELET
BASOS ABS: 0 10*3/uL (ref 0.0–0.1)
Basophils Relative: 0 % (ref 0–1)
EOS PCT: 1 % (ref 0–5)
Eosinophils Absolute: 0.1 10*3/uL (ref 0.0–0.7)
HEMATOCRIT: 41.2 % (ref 39.0–52.0)
Hemoglobin: 13.3 g/dL (ref 13.0–17.0)
LYMPHS PCT: 6 % — AB (ref 12–46)
Lymphs Abs: 0.7 10*3/uL (ref 0.7–4.0)
MCH: 26.7 pg (ref 26.0–34.0)
MCHC: 32.3 g/dL (ref 30.0–36.0)
MCV: 82.6 fL (ref 78.0–100.0)
Monocytes Absolute: 1.3 10*3/uL — ABNORMAL HIGH (ref 0.1–1.0)
Monocytes Relative: 11 % (ref 3–12)
Neutro Abs: 9.3 10*3/uL — ABNORMAL HIGH (ref 1.7–7.7)
Neutrophils Relative %: 82 % — ABNORMAL HIGH (ref 43–77)
PLATELETS: 161 10*3/uL (ref 150–400)
RBC: 4.99 MIL/uL (ref 4.22–5.81)
RDW: 14.7 % (ref 11.5–15.5)
WBC: 11.4 10*3/uL — AB (ref 4.0–10.5)

## 2014-06-11 LAB — URINE MICROSCOPIC-ADD ON

## 2014-06-11 LAB — URINALYSIS, ROUTINE W REFLEX MICROSCOPIC
Bilirubin Urine: NEGATIVE
GLUCOSE, UA: NEGATIVE mg/dL
HGB URINE DIPSTICK: NEGATIVE
KETONES UR: NEGATIVE mg/dL
LEUKOCYTES UA: NEGATIVE
Nitrite: NEGATIVE
Protein, ur: 30 mg/dL — AB
Specific Gravity, Urine: 1.017 (ref 1.005–1.030)
Urobilinogen, UA: 1 mg/dL (ref 0.0–1.0)
pH: 6 (ref 5.0–8.0)

## 2014-06-11 LAB — TROPONIN I: Troponin I: <0.03 ng/mL (ref <0.031)

## 2014-06-11 LAB — D-DIMER, QUANTITATIVE (NOT AT ARMC): D DIMER QUANT: 0.62 ug{FEU}/mL — AB (ref 0.00–0.48)

## 2014-06-11 MED ORDER — MECLIZINE HCL 25 MG PO TABS
25.0000 mg | ORAL_TABLET | Freq: Once | ORAL | Status: AC
  Administered 2014-06-11: 25 mg via ORAL
  Filled 2014-06-11: qty 1

## 2014-06-11 MED ORDER — IOHEXOL 350 MG/ML SOLN
100.0000 mL | Freq: Once | INTRAVENOUS | Status: AC | PRN
  Administered 2014-06-11: 100 mL via INTRAVENOUS

## 2014-06-11 MED ORDER — SODIUM CHLORIDE 0.9 % IV BOLUS (SEPSIS)
250.0000 mL | Freq: Once | INTRAVENOUS | Status: AC
  Administered 2014-06-11: 250 mL via INTRAVENOUS

## 2014-06-11 NOTE — ED Notes (Signed)
Voided 125cc urine

## 2014-06-11 NOTE — ED Notes (Signed)
Pt to CT and returned to room via wc.  Monitor reapplied and urinal provided.

## 2014-06-11 NOTE — Progress Notes (Signed)
Pre visit review using our clinic review tool, if applicable. No additional management support is needed unless otherwise documented below in the visit note. 

## 2014-06-11 NOTE — Assessment & Plan Note (Signed)
With unsteady gait. Acute dizziness since last night unsafe to ambulate and concern for acute fall. Also concern for central cause of dizziness and well may have some electrolyte abnomalities. Pt is here by himself. I don't think safe to treat out patient without possibly doing some stat studies such as cmp, cbc, and possibly CT of the head 1st.  I contacted charge nurse down stairs in ED  Follow up here after ED eval.

## 2014-06-11 NOTE — ED Notes (Signed)
Pt assisted up by RT to walk. Pt walked across hall to nurse desk from his room. Pt very unsteady and became very pale and sob. Pulse ox 85%. Dr Aline Brochure aware.

## 2014-06-11 NOTE — ED Notes (Signed)
Patient transported to CT 

## 2014-06-11 NOTE — Progress Notes (Signed)
Subjective:    Patient ID: Wayne Nash, male    DOB: [birth year], 81 y.o.   MRN: [id number]  HPI   Pt in dizziness. He could not walk do to dizziness last night. Felt like he was going to fall. Scared to walk. He is about the same know.  No chest pain. No popliteal pain. No other associated neurologic type signs or symptoms.   Pt benadryl last night. Mild cold symptom reported to pt recently.   No sob but baseline low o2 sat.  Review of Systems  Constitutional: Negative for fever, chills, diaphoresis, activity change and fatigue.  Respiratory: Negative for cough, chest tightness and shortness of breath.   Cardiovascular: Negative for chest pain, palpitations and leg swelling.  Gastrointestinal: Negative for nausea, vomiting and abdominal pain.  Musculoskeletal: Negative for neck pain and neck stiffness.  Neurological: Positive for dizziness. Negative for tremors, seizures, syncope, facial asymmetry, speech difficulty, weakness, light-headedness, numbness and headaches.  Psychiatric/Behavioral: Negative for behavioral problems, confusion and agitation. The patient is not nervous/anxious.    Past Medical History  Diagnosis Date  . Hyperlipidemia   . Hypertension     History   Social History  . Marital Status: Divorced    Spouse Name: N/A  . Number of Children: 3  . Years of Education: N/A   Occupational History  . 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 Smoking status: Former Smoker    Quit date: 02/22/1994  . Smokeless tobacco: Not on file     Comment: smoked 1953-1996, up to 1.5 ppd  . Alcohol Use: No     Comment:  quit 1985  . Drug Use: No  . Sexual Activity: Not on file   Other Topics Concern  . Not on file   Social History Narrative   7 Gc, 3 GGchildren   lives by himself     Past Surgical History  Procedure Laterality Date  . Colonoscopy  2012     negative  . Appendectomy    . Lumbar disc surgery     Dr Shellia Carwin  . Skin biopsy      Family History  Problem Relation Age of Onset  . Cancer Mother     pancreatic  . Diabetes Neg Hx   . Heart disease Neg Hx   . Stroke Neg Hx     Allergies  Allergen Reactions  . Penicillins     REACTION: SWELLING/HIVES    No current facility-administered medications on file prior to visit.   Current Outpatient Prescriptions on File Prior to Visit  Medication Sig Dispense Refill  . aspirin 81 MG tablet Take 81 mg by mouth daily.    Marland Kitchen atorvastatin (LIPITOR) 20 MG tablet Take 1 tablet (20 mg total) by mouth daily. 90 tablet 3  . furosemide (LASIX) 20 MG tablet Take 2 tablets (40 mg total) by mouth daily. 60 tablet 5  . carvedilol (COREG) 6.25 MG tablet Take 1 tablet (6.25 mg total) by mouth 2 (two) times daily with a meal. 60 tablet 3  . HYDROcodone-acetaminophen (NORCO) 5-325 MG per tablet Take 1 tablet by mouth every 6 (six) hours as needed for moderate pain. 60 tablet 0    BP 156/67 mmHg  Pulse 81  Temp(Src) 98.4 F (36.9 C) (Oral)  Ht 5' 0.5" (1.537 m)  SpO2 90%      Objective:   Physical Exam  General Mental Status- Alert. General Appearance- Not  in acute distress.   Skin General: Color- Normal Color. Moisture- Normal Moisture.  Neck Carotid Arteries- Normal color. Moisture- Normal Moisture. No carotid bruits. No JVD.  Chest and Lung Exam Auscultation: Breath Sounds:-Normal.  Cardiovascular Auscultation:Rythm- Regular. Murmurs & Other Heart Sounds:Auscultation of the heart reveals- No Murmurs.  Abdomen Inspection:-Inspeection Normal. Palpation/Percussion:Note:No mass. Palpation and Percussion of the abdomen reveal- Non Tender, Non Distended + BS, no rebound or guarding.    Neurologic Cranial Nerve exam:- CN III-XII intact(No nystagmus), symmetric smile. Drift Test:- No drift. Romberg Exam:- Poor romberg. Looked unstable and leaning forward. Had to stop test. Heal to Toe Gait exam:-unstable gate. Uses cane Finger  to Nose:- Normal/Intact Strength:- 5/5 equal and symmetric strength both upper and lower extremities.      Assessment & Plan:

## 2014-06-11 NOTE — Patient Instructions (Signed)
With unsteady gait. Acute dizziness since last night unsafe to ambulate and concern for acute fall. Also concern for central cause of dizziness and well may have some electrolyte abnomalities. Pt is here by himself. I don't think safe to treat out patient without possibly doing some stat studies such as cmp, cbc, and possibly CT of the head 1st.  I contacted charge nurse down stairs in ED  Follow up here after ED eval.

## 2014-06-11 NOTE — ED Notes (Signed)
C/o dizziness onset 0300 this am when he got up from bed. No h/a, no weakness. No n/v.

## 2014-06-11 NOTE — ED Notes (Signed)
Grand daughter at bedside, pt waiting on bed

## 2014-06-11 NOTE — ED Notes (Signed)
Voided 250cc urine

## 2014-06-11 NOTE — ED Provider Notes (Signed)
CSN: [id number]     Arrival date & time 06/11/14  9518 History   First MD Initiated Contact with Patient 06/11/14 [phone]     Chief Complaint  Patient presents with  . Dizziness     (Consider location/radiation/quality/duration/timing/severity/associated sxs/prior Treatment) Patient is a 81 y.o. male presenting with dizziness. The history is provided by the patient.  Dizziness Quality:  Unable to specify Severity:  Mild Onset quality:  Sudden Duration: 5-6 hours. Timing:  Constant Progression:  Unchanged Chronicity:  New Context comment:  Upon awakening,  Relieved by:  Nothing Exacerbated by: head movement. Ineffective treatments:  None tried Associated symptoms: no chest pain, no diarrhea, no headaches, no nausea, no shortness of breath and no vomiting     Past Medical History  Diagnosis Date  . Hyperlipidemia   . Hypertension    Past Surgical History  Procedure Laterality Date  . Colonoscopy  2012     negative  . Appendectomy    . Lumbar disc surgery      Dr Shellia Carwin  . Skin biopsy     Family History  Problem Relation Age of Onset  . Cancer Mother     pancreatic  . Diabetes Neg Hx   . Heart disease Neg Hx   . Stroke Neg Hx    History  Substance Use Topics  . Smoking status: Former Smoker    Quit date: 02/22/1994  . Smokeless tobacco: Not on file     Comment: smoked 1953-1996, up to 1.5 ppd  . Alcohol Use: No     Comment:  quit 1985    Review of Systems  Constitutional: Negative for fever.  HENT: Negative for drooling and rhinorrhea.   Eyes: Negative for pain.  Respiratory: Negative for cough and shortness of breath.   Cardiovascular: Negative for chest pain and leg swelling.  Gastrointestinal: Negative for nausea, vomiting, abdominal pain and diarrhea.  Genitourinary: Negative for dysuria and hematuria.  Musculoskeletal: Negative for gait problem and neck pain.  Skin: Negative for color change.  Neurological: Positive for dizziness. Negative for  numbness and headaches.  Hematological: Negative for adenopathy.  Psychiatric/Behavioral: Negative for behavioral problems.  All other systems reviewed and are negative.     Allergies  Penicillins  Home Medications   Prior to Admission medications   Medication Sig Start Date End Date Taking? Authorizing Provider  aspirin 81 MG tablet Take 81 mg by mouth daily.   Yes Historical Provider, MD  atorvastatin (LIPITOR) 20 MG tablet Take 1 tablet (20 mg total) by mouth daily. 05/20/14  Yes Colon Branch, MD  carvedilol (COREG) 6.25 MG tablet Take 1 tablet (6.25 mg total) by mouth 2 (two) times daily with a meal. 12/03/13  Yes Colon Branch, MD  furosemide (LASIX) 20 MG tablet Take 2 tablets (40 mg total) by mouth daily. 05/20/14  Yes Colon Branch, MD  HYDROcodone-acetaminophen (NORCO) 5-325 MG per tablet Take 1 tablet by mouth every 6 (six) hours as needed for moderate pain. 04/19/14  Yes Meriam Sprague Saguier, PA-C   BP 139/57 mmHg  Pulse 73  Temp(Src) 98.7 F (37.1 C) (Oral)  Resp 24  Ht 5\' 9"  (1.753 m)  Wt 230 lb (104.327 kg)  BMI 33.95 kg/m2  SpO2 90% Physical Exam  Constitutional: He is oriented to person, place, and time. He appears well-developed and well-nourished.  HENT:  Head: Normocephalic and atraumatic.  Right Ear: External ear normal.  Left Ear: External ear normal.  Nose: Nose normal.  Mouth/Throat:  Oropharynx is clear and moist. No oropharyngeal exudate.  Eyes: Conjunctivae and EOM are normal. Pupils are equal, round, and reactive to light.  Neck: Normal range of motion. Neck supple.  Cardiovascular: Normal rate, regular rhythm, normal heart sounds and intact distal pulses.  Exam reveals no gallop and no friction rub.   No murmur heard. Pulmonary/Chest: Effort normal and breath sounds normal. No respiratory distress. He has no wheezes.  Abdominal: Soft. Bowel sounds are normal. He exhibits no distension. There is no tenderness. There is no rebound and no guarding.   Musculoskeletal: Normal range of motion. He exhibits edema. He exhibits no tenderness.  Mild pitting edema of distal bilateral lower extremities.  Neurological: He is alert and oriented to person, place, and time.  alert, oriented x3 speech: normal in context and clarity memory: intact grossly cranial nerves II-XII: intact motor strength: full proximally and distally sensation: intact to light touch diffusely  cerebellar: finger-to-nose and heel-to-shin intact gait: normal forwards and backwards  Skin: Skin is warm and dry.  Psychiatric: He has a normal mood and affect. His behavior is normal.  Nursing note and vitals reviewed.   ED Course  Procedures (including critical care time) Labs Review Labs Reviewed  CBC WITH DIFFERENTIAL/PLATELET - Abnormal; Notable for the following:    WBC 11.4 (*)    Neutrophils Relative % 82 (*)    Neutro Abs 9.3 (*)    Lymphocytes Relative 6 (*)    Monocytes Absolute 1.3 (*)    All other components within normal limits  COMPREHENSIVE METABOLIC PANEL - Abnormal; Notable for the following:    Potassium 3.2 (*)    CO2 35 (*)    Glucose, Bld 136 (*)    All other components within normal limits  URINALYSIS, ROUTINE W REFLEX MICROSCOPIC - Abnormal; Notable for the following:    Protein, ur 30 (*)    All other components within normal limits  URINE MICROSCOPIC-ADD ON - Abnormal; Notable for the following:    Bacteria, UA FEW (*)    All other components within normal limits  D-DIMER, QUANTITATIVE - Abnormal; Notable for the following:    D-Dimer, Quant 0.62 (*)    All other components within normal limits  TROPONIN I    Imaging Review Dg Chest 2 View  06/11/2014   CLINICAL DATA:  Dizziness.  EXAM: CHEST  2 VIEW  COMPARISON:  04/12/2014  FINDINGS: Heart size is normal. Negative for heart failure. Lungs are clear without infiltrate or effusion. Mild atelectasis/ scarring in the left lung base.  Advanced degenerative change left shoulder joint.  Possible AVN left humeral head.  IMPRESSION: Mild left lower lobe atelectasis/ scarring. Lungs otherwise clear. No acute abnormality.   Electronically Signed   By: Franchot Gallo M.D.   On: 06/11/2014 10:06   Ct Head Wo Contrast  06/11/2014   CLINICAL DATA:  Dizziness  EXAM: CT HEAD WITHOUT CONTRAST  TECHNIQUE: Contiguous axial images were obtained from the base of the skull through the vertex without intravenous contrast.  COMPARISON:  None.  FINDINGS: Generalized atrophy.  Negative for hydrocephalus.  Mild chronic microvascular ischemic change in the white matter. No acute infarct. Negative for hemorrhage or mass.  Calvarium intact.  IMPRESSION: Atrophy and mild chronic microvascular ischemia. No acute abnormality.   Electronically Signed   By: Franchot Gallo M.D.   On: 06/11/2014 10:28     EKG Interpretation   Date/Time:  Tuesday June 11 2014 09:16:00 EDT Ventricular Rate:  70 PR Interval:  160 QRS Duration: 94 QT Interval:  380 QTC Calculation: 410 R Axis:   77 Text Interpretation:  Normal sinus rhythm Nonspecific ST abnormality no  previous for comparison Confirmed by Gilverto Dileonardo  MD, Nyela Cortinas ([id number]) on  06/11/2014 9:25:35 AM      MDM   Final diagnoses:  Dizziness  Hypoxia    9:02 AM 81 y.o. male w hx of HTN, hLP, chronic low back pain on norco who presents with dizziness upon awakening this morning. He states that he felt like he was getting sick yesterday but denies any cough or congestion or fever. He notes the dizziness immediately upon awakening this morning and it has been persistent since then. Slightly worse when he looks towards the ground. Afebrile and vital signs unremarkable here. We'll get screening labs and imaging. Normal neuro exam.   Hypoxic now on exam and w/ ambulation to mid 80's.   CTA neg. Pt continues to appear well. Stable on 2L [place]. Pt's dizziness seems to be resolving.  Discussed case w/ Dr. Bobbye Charleston at [facility]. We'll plan on  transfer.  Any medications given in the ED during this visit are listed below:  Medications  sodium chloride 0.9 % bolus 250 mL (0 mLs Intravenous Stopped 06/11/14 1000)  meclizine (ANTIVERT) tablet 25 mg (25 mg Oral Given 06/11/14 0924)  iohexol (OMNIPAQUE) 350 MG/ML injection 100 mL (100 mLs Intravenous Contrast Given 06/11/14 1257)      Pamella Pert, MD 06/11/14 [id number]

## 2014-06-14 DIAGNOSIS — J189 Pneumonia, unspecified organism: Secondary | ICD-10-CM | POA: Insufficient documentation

## 2014-06-19 DIAGNOSIS — E538 Deficiency of other specified B group vitamins: Secondary | ICD-10-CM | POA: Insufficient documentation

## 2014-06-20 ENCOUNTER — Other Ambulatory Visit: Payer: Self-pay

## 2014-06-25 ENCOUNTER — Telehealth: Payer: Self-pay | Admitting: *Deleted

## 2014-06-25 NOTE — Telephone Encounter (Signed)
FYI- called patient to follow-up from hospital and patient states he is in [facility], skilled nursing facility in [place] for the time being

## 2014-06-25 NOTE — Telephone Encounter (Signed)
Thanks

## 2014-06-26 ENCOUNTER — Other Ambulatory Visit: Payer: Self-pay

## 2014-07-02 ENCOUNTER — Telehealth: Payer: Self-pay | Admitting: Internal Medicine

## 2014-07-02 NOTE — Telephone Encounter (Signed)
Pre Visit letter sent  °

## 2014-07-08 ENCOUNTER — Ambulatory Visit (INDEPENDENT_AMBULATORY_CARE_PROVIDER_SITE_OTHER): Payer: PPO | Admitting: Medical

## 2014-07-08 ENCOUNTER — Encounter: Payer: Self-pay | Admitting: Medical

## 2014-07-08 VITALS — BP 177/81 | HR 63 | Temp 97.7°F | Ht 60.5 in | Wt 227.2 lb

## 2014-07-08 DIAGNOSIS — M545 Low back pain: Secondary | ICD-10-CM | POA: Diagnosis not present

## 2014-07-08 DIAGNOSIS — J439 Emphysema, unspecified: Secondary | ICD-10-CM | POA: Diagnosis not present

## 2014-07-08 DIAGNOSIS — E785 Hyperlipidemia, unspecified: Secondary | ICD-10-CM

## 2014-07-08 DIAGNOSIS — R911 Solitary pulmonary nodule: Secondary | ICD-10-CM

## 2014-07-08 DIAGNOSIS — R42 Dizziness and giddiness: Secondary | ICD-10-CM

## 2014-07-08 MED ORDER — ATORVASTATIN CALCIUM 20 MG PO TABS: 20.0000 mg | ORAL_TABLET | Freq: Every day | ORAL | Status: DC

## 2014-07-08 MED ORDER — TAMSULOSIN HCL 0.4 MG PO CAPS: 0.4000 mg | ORAL_CAPSULE | Freq: Every day | ORAL | Status: DC

## 2014-07-08 MED ORDER — HYDROCODONE-ACETAMINOPHEN 5-325 MG PO TABS: 1.0000 | ORAL_TABLET | Freq: Four times a day (QID) | ORAL | Status: DC | PRN

## 2014-07-08 MED ORDER — CARVEDILOL 6.25 MG PO TABS: 6.2500 mg | ORAL_TABLET | Freq: Two times a day (BID) | ORAL | Status: DC

## 2014-07-08 MED ORDER — TIOTROPIUM BROMIDE MONOHYDRATE 18 MCG IN CAPS: 18.0000 ug | ORAL_CAPSULE | Freq: Every day | RESPIRATORY_TRACT | Status: AC

## 2014-07-08 NOTE — Assessment & Plan Note (Signed)
Dizziness  resolved now. Pt has been to PT.

## 2014-07-08 NOTE — Progress Notes (Signed)
Subjective:    Patient ID: AUTHOR HATLESTAD, male    DOB: [birth year], 81 y.o.   MRN: [id number]  HPI  Pt in for follow up for follow up. Pt had low 02 sat day I saw him in April . In ED he had elevated D-dimer. Then he was transferred to [facility]. He was negative for pulmonary embolism.He was pretty dizzy when I saw him as well . Dizziness at hospital persisted and he had fairly extensive work up. Pt was then sent to nursing home briefly. He had rehab for about 5 days.  Pt was given spiriva at hospital and this has helped. He is also using the [place] device.. Pt o2 sat is now 96% at rest.   No dizziness. No sob, no chest pain.   Has been about one month since I last saw him.    Review of Systems  Constitutional: Negative for fever, chills, diaphoresis, activity change and fatigue.       Note prior symptoms now resolved.  Respiratory: Negative for cough, chest tightness and shortness of breath.   Cardiovascular: Negative for chest pain, palpitations and leg swelling.  Gastrointestinal: Negative for nausea, vomiting and abdominal pain.  Musculoskeletal: Negative for neck pain and neck stiffness.  Neurological: Negative for dizziness, tremors, seizures, syncope, facial asymmetry, speech difficulty, weakness, light-headedness, numbness and headaches.  Psychiatric/Behavioral: Negative for behavioral problems, confusion and agitation. The patient is not nervous/anxious.    Past Medical History  Diagnosis Date  . Hyperlipidemia   . Hypertension     History   Social History  . Marital Status: Divorced    Spouse Name: N/A  . Number of Children: 3  . Years of Education: N/A   Occupational History  . 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 Smoking status: Former Smoker    Quit date: 02/22/1994  . Smokeless tobacco: Not on file     Comment: smoked 1953-1996, up to 1.5 ppd  . Alcohol Use: No     Comment:  quit 1985  . Drug  Use: No  . Sexual Activity: Not on file   Other Topics Concern  . Not on file   Social History Narrative   7 Gc, 3 GGchildren   lives by himself     Past Surgical History  Procedure Laterality Date  . Colonoscopy  2012     negative  . Appendectomy    . Lumbar disc surgery      Dr Shellia Carwin  . Skin biopsy      Family History  Problem Relation Age of Onset  . Cancer Mother     pancreatic  . Diabetes Neg Hx   . Heart disease Neg Hx   . Stroke Neg Hx     Allergies  Allergen Reactions  . Penicillins     REACTION: SWELLING/HIVES    Current Outpatient Prescriptions on File Prior to Visit  Medication Sig Dispense Refill  . aspirin 81 MG tablet Take 81 mg by mouth daily.    Marland Kitchen doxepin (SINEQUAN) 10 MG capsule Take 10 mg by mouth at bedtime as needed. for sleep  4  . furosemide (LASIX) 20 MG tablet Take 2 tablets (40 mg total) by mouth daily. (Patient not taking: Reported on 07/08/2014) 60 tablet 5   No current facility-administered medications on file prior to visit.    BP 177/81 mmHg  Pulse 63  Temp(Src) 97.7 F (36.5 C) (Oral)  Ht 5' 0.5" (1.537 m)  Wt 227 lb 3.2 oz (103.057 kg)  BMI 43.62 kg/m2  SpO2 95%       Objective:   Physical Exam  General Mental Status- Alert. General Appearance- Not in acute distress.   Skin General: Color- Normal Color. Moisture- Normal Moisture.  Neck Carotid Arteries- Normal color. Moisture- Normal Moisture. No carotid bruits. No JVD.  Chest and Lung Exam Auscultation: Breath Sounds:-Normal. CTA  Cardiovascular Auscultation:Rythm- Regular. Murmurs & Other Heart Sounds:Auscultation of the heart reveals- No Murmurs.  Abdomen Inspection:-Inspeection Normal. Palpation/Percussion:Note:No mass. Palpation and Percussion of the abdomen reveal- Non Tender, Non Distended + BS, no rebound or guarding.    Neurologic Cranial Nerve exam:- CN III-XII intact(No nystagmus), symmetric smile. Strength:- 5/5 equal and symmetric  strength both upper and lower extremities. Still walking slow with cane but some better than before.      Assessment & Plan:

## 2014-07-08 NOTE — Assessment & Plan Note (Signed)
Will put reminder in computer to repeat in 3 months.

## 2014-07-08 NOTE — Progress Notes (Signed)
Pre visit review using our clinic review tool, if applicable. No additional management support is needed unless otherwise documented below in the visit note. 

## 2014-07-08 NOTE — Patient Instructions (Addendum)
Dizziness resolved now. Pt has been to PT.  Appears diagnosed with some copd. Pt doing well with spiriva.  Pt can continue [place].  Will refill your carvedilol today. For bp. You were tasking carvedilol only one time a day. I want you to start it twice a day. This may help bring bp under better control. When you follow up in 10 days. Please bring your bp readings.  Refill you atorvastatin today. Get lipid panel in 2 wks come early am and fasting.  Will put in future order for ct of chest due to nodule seen on CT at med center.  Follow up 10 days will recheck lungs. May repeat cxr at time. At hosptial some xrays showed maybe infiltrate but lungs sound clear today and no signs of infection.  Back pain. Hx of. Rx of hydrocodone.  Pt attempt to put reminder in computer to get repeat ct for chest nodule in 3 months.

## 2014-07-12 ENCOUNTER — Telehealth: Payer: Self-pay | Admitting: Internal Medicine

## 2014-07-12 NOTE — Telephone Encounter (Signed)
That is ok 

## 2014-07-12 NOTE — Telephone Encounter (Signed)
Caller name: Altha Harm from [facility] Relation to pt: Call back number: [phone] Pharmacy:  Reason for call:   Needs Verbal orders to continue with PT. Two times a week for four weeks.

## 2014-07-12 NOTE — Telephone Encounter (Signed)
LMOM for Wayne Nash informing her okay to continue PT  2 times weekly for 4 weeks. Informed her to call if she had any questions.

## 2014-07-12 NOTE — Telephone Encounter (Signed)
Please advise if okay to give verbal orders  

## 2014-07-15 ENCOUNTER — Telehealth: Payer: Self-pay | Admitting: *Deleted

## 2014-07-15 NOTE — Telephone Encounter (Signed)
If he did not have syncope or pass out ---> no need for immediate office visit. Ice   and use Tylenol as needed. OV  in one or 2 days if he is not gradually improving.

## 2014-07-15 NOTE — Telephone Encounter (Signed)
PT wanted to notify MD-  Physical Therapist from [facility] called stating that she visited patient for PT and he notified her of a fall last night.  He does not report any dizziness currently.  He fell on his left elbow, which has a skin tear (bandaid placed) and his right leg with mild swelling in right ankle.  No bruising, patient did not hit any other areas.  PT had patient elevate right leg and place ice on it- no bruising at this time.  Patient can bear weight on leg, but it is painful.

## 2014-07-15 NOTE — Telephone Encounter (Signed)
Notified patient.  He stated that he did not have syncopal episode.  He is elevating leg and placing ice on ankle.  Patient has appointment scheduled with Mackie Pai, PA on 07/18/14.  Patient states he will call back to be seen before appointment if ankle is getting worse.

## 2014-07-15 NOTE — Telephone Encounter (Signed)
FYI

## 2014-07-18 ENCOUNTER — Ambulatory Visit (INDEPENDENT_AMBULATORY_CARE_PROVIDER_SITE_OTHER): Payer: PPO | Admitting: Medical

## 2014-07-18 VITALS — BP 148/70 | HR 72 | Temp 98.0°F | Resp 16 | Wt 226.0 lb

## 2014-07-18 DIAGNOSIS — I10 Essential (primary) hypertension: Secondary | ICD-10-CM

## 2014-07-18 DIAGNOSIS — R42 Dizziness and giddiness: Secondary | ICD-10-CM

## 2014-07-18 MED ORDER — CARVEDILOL 6.25 MG PO TABS: ORAL_TABLET | ORAL | Status: DC

## 2014-07-18 NOTE — Assessment & Plan Note (Addendum)
Better than last time continue same treatment.  Refilled his carvedilol. Pt is only taking one tab a day. This was on his last rx. His bp is ok today. May go back to bid if his bp is increasing. Note I believe the change on carvedilol to one a day was on discharge from hospital.

## 2014-07-18 NOTE — Assessment & Plan Note (Signed)
This is transient and mostly on position change. Negative neurologic exam. Counseled on making dizziness resolves before ambulating. Wait until sensation resolves. Then ambulate. Continue PT. If this accompanies other neurologic sympotms or worsens then ED evaluation. Continue with PT.

## 2014-07-18 NOTE — Patient Instructions (Signed)
Dizziness and giddiness This is transient and mostly on position change. Negative neurologic exam. Counseled on making dizziness resolves before ambulating. Wait until sensation resolves. Then ambulate. Continue PT. If this accompanies other neurologic sympotms or worsens then ED evaluation. Continue with PT.   HTN (hypertension) Better than last time continue same treatment.     Follow up in 2 months or as needed.

## 2014-07-18 NOTE — Progress Notes (Signed)
Subjective:    Patient ID: Wayne Nash, male    DOB: 1934/02/11, 81 y.o.   MRN: [id number]  HPI   Pt in states he has been dizziness for months. Not new. Pt had this on last visit. This is chronic. Pt was sent to ED on the 19 of April then was admitted to Hospital/[facility]. Pt is currently going to PT to help with his gait. He only has slight faint dizzy sensation but no other neurologic type signs or symptoms. Mostly when he gets up from seated position.   He clarifies that dizziness usually on changing positions and last for about 30 seconds. Not constant type. No associated neurologic.      Review of Systems  Constitutional: Negative for fever, chills, diaphoresis, activity change and fatigue.  Respiratory: Negative for cough, chest tightness and shortness of breath.   Cardiovascular: Negative for chest pain, palpitations and leg swelling.  Gastrointestinal: Negative for nausea, vomiting and abdominal pain.  Musculoskeletal: Negative for neck pain and neck stiffness.  Neurological: Positive for dizziness. Negative for tremors, seizures, syncope, facial asymmetry, speech difficulty, weakness, light-headedness, numbness and headaches.       Faint and most prominent on position changes.  Psychiatric/Behavioral: Negative for behavioral problems, confusion and agitation. The patient is not nervous/anxious.      Past Medical History  Diagnosis Date  . Hyperlipidemia   . Hypertension     History   Social History  . Marital Status: Divorced    Spouse Name: N/A  . Number of Children: 3  . Years of Education: N/A   Occupational History  . 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 Smoking status: Former Smoker    Quit date: 02/22/1994  . Smokeless tobacco: Not on file     Comment: smoked 1953-1996, up to 1.5 ppd  . Alcohol Use: No     Comment:  quit 1985  . Drug Use: No  . Sexual Activity: Not on file   Other Topics Concern    . Not on file   Social History Narrative   7 Gc, 3 GGchildren   lives by himself     Past Surgical History  Procedure Laterality Date  . Colonoscopy  2012     negative  . Appendectomy    . Lumbar disc surgery      Dr Shellia Carwin  . Skin biopsy      Family History  Problem Relation Age of Onset  . Cancer Mother     pancreatic  . Diabetes Neg Hx   . Heart disease Neg Hx   . Stroke Neg Hx     Allergies  Allergen Reactions  . Penicillins     REACTION: SWELLING/HIVES    Current Outpatient Prescriptions on File Prior to Visit  Medication Sig Dispense Refill  . aspirin 81 MG tablet Take 81 mg by mouth daily.    Marland Kitchen atorvastatin (LIPITOR) 20 MG tablet Take 1 tablet (20 mg total) by mouth daily. 30 tablet 0  . carvedilol (COREG) 6.25 MG tablet Take 1 tablet (6.25 mg total) by mouth 2 (two) times daily with a meal. 60 tablet 3  . doxepin (SINEQUAN) 10 MG capsule Take 10 mg by mouth at bedtime as needed. for sleep  4  . furosemide (LASIX) 20 MG tablet Take 2 tablets (40 mg total) by mouth daily. 60 tablet 5  . HYDROcodone-acetaminophen (NORCO) 5-325 MG per tablet Take 1 tablet  by mouth every 6 (six) hours as needed for moderate pain. 20 tablet 0  . LORazepam (ATIVAN) 0.5 MG tablet Take 0.5 mg by mouth every 4 (four) hours as needed for anxiety.    Marland Kitchen Respiratory Therapy Supplies (AEROBIKA) DEVI by Does not apply route.    . tamsulosin (FLOMAX) 0.4 MG CAPS capsule Take 1 capsule (0.4 mg total) by mouth daily. 30 capsule 3  . tiotropium (SPIRIVA) 18 MCG inhalation capsule Place 1 capsule (18 mcg total) into inhaler and inhale daily. 30 capsule 2   No current facility-administered medications on file prior to visit.    BP 148/70 mmHg  Pulse 72  Temp(Src) 98 F (36.7 C) (Oral)  Resp 16  Wt 226 lb (102.513 kg)  SpO2 97%       Objective:   Physical Exam  General Mental Status- Alert. General Appearance- Not in acute distress.   Skin General: Color- Normal Color.  Moisture- Normal Moisture.  Neck Carotid Arteries- Normal color. Moisture- Normal Moisture. No carotid bruits. No JVD.  Chest and Lung Exam Auscultation: Breath Sounds:-Normal. CTA.  Cardiovascular Auscultation:Rythm- Regular, Rate, and Rhythm. Murmurs & Other Heart Sounds:Auscultation of the heart reveals- No Murmurs.  Abdomen Inspection:-Inspeection Normal. Palpation/Percussion:Note:No mass. Palpation and Percussion of the abdomen reveal- Non Tender, Non Distended + BS, no rebound or guarding.    Neurologic Cranial Nerve exam:- CN III-XII intact(No nystagmus), symmetric smile. Drift Test:- No drift. Romberg Exam:- Negative.  .Finger to Nose:- Normal/Intact Strength:- 5/5 equal and symmetric strength both upper and lower extremities.      Assessment & Plan:

## 2014-07-18 NOTE — Progress Notes (Signed)
Pre visit review using our clinic review tool, if applicable. No additional management support is needed unless otherwise documented below in the visit note. 

## 2014-07-23 ENCOUNTER — Ambulatory Visit (INDEPENDENT_AMBULATORY_CARE_PROVIDER_SITE_OTHER): Payer: PPO | Admitting: Internal Medicine

## 2014-07-23 ENCOUNTER — Telehealth: Payer: Self-pay

## 2014-07-23 ENCOUNTER — Encounter: Payer: Self-pay | Admitting: Internal Medicine

## 2014-07-23 VITALS — BP 128/76 | HR 72 | Temp 97.8°F | Ht 61.0 in | Wt 225.2 lb

## 2014-07-23 DIAGNOSIS — I10 Essential (primary) hypertension: Secondary | ICD-10-CM

## 2014-07-23 DIAGNOSIS — R7303 Prediabetes: Secondary | ICD-10-CM

## 2014-07-23 DIAGNOSIS — R609 Edema, unspecified: Secondary | ICD-10-CM | POA: Diagnosis not present

## 2014-07-23 DIAGNOSIS — R0600 Dyspnea, unspecified: Secondary | ICD-10-CM

## 2014-07-23 DIAGNOSIS — Z23 Encounter for immunization: Secondary | ICD-10-CM

## 2014-07-23 DIAGNOSIS — R269 Unspecified abnormalities of gait and mobility: Secondary | ICD-10-CM | POA: Diagnosis not present

## 2014-07-23 DIAGNOSIS — E785 Hyperlipidemia, unspecified: Secondary | ICD-10-CM | POA: Diagnosis not present

## 2014-07-23 DIAGNOSIS — G4700 Insomnia, unspecified: Secondary | ICD-10-CM

## 2014-07-23 DIAGNOSIS — Z0000 Encounter for general adult medical examination without abnormal findings: Secondary | ICD-10-CM | POA: Diagnosis not present

## 2014-07-23 DIAGNOSIS — R7309 Other abnormal glucose: Secondary | ICD-10-CM | POA: Diagnosis not present

## 2014-07-23 DIAGNOSIS — E538 Deficiency of other specified B group vitamins: Secondary | ICD-10-CM

## 2014-07-23 LAB — LIPID PANEL
CHOL/HDL RATIO: 3
Cholesterol: 144 mg/dL (ref 0–200)
HDL: 47.6 mg/dL (ref >39.00)
LDL Cholesterol: 80 mg/dL (ref 0–99)
NonHDL: 96.4
Triglycerides: 84 mg/dL (ref 0.0–149.0)
VLDL: 16.8 mg/dL (ref 0.0–40.0)

## 2014-07-23 LAB — BASIC METABOLIC PANEL
BUN: 11 mg/dL (ref 6–23)
CALCIUM: 9.1 mg/dL (ref 8.4–10.5)
CO2: 31 meq/L (ref 19–32)
CREATININE: 0.44 mg/dL (ref 0.40–1.50)
Chloride: 104 mEq/L (ref 96–112)
GFR: 197.62 mL/min (ref >60.00)
GLUCOSE: 100 mg/dL — AB (ref 70–99)
Potassium: 3.3 mEq/L — ABNORMAL LOW (ref 3.5–5.1)
Sodium: 141 mEq/L (ref 135–145)

## 2014-07-23 LAB — VITAMIN B12: Vitamin B-12: 342 pg/mL (ref 211–911)

## 2014-07-23 LAB — HEMOGLOBIN A1C: Hgb A1c MFr Bld: 5.7 % (ref 4.6–6.5)

## 2014-07-23 MED ORDER — CARVEDILOL 6.25 MG PO TABS: 6.2500 mg | ORAL_TABLET | Freq: Two times a day (BID) | ORAL | Status: DC

## 2014-07-23 NOTE — Assessment & Plan Note (Signed)
Due for labs

## 2014-07-23 NOTE — Assessment & Plan Note (Signed)
Seems well-controlled at the present time

## 2014-07-23 NOTE — Progress Notes (Signed)
Subjective:    Patient ID: Wayne Nash, male    DOB: [birth year], 81 y.o.   MRN: [id number]  DOS:  07/23/2014 Type of visit - description :  Here for Medicare AWV:  1. Risk factors based on Past M, S, F history: reviewed 2. Physical Activities: doing PT at home, trying to walk some  3. Depression/mood: neg screening  4. Hearing:  No problems noted or reported  5. ADL's: independent, drives  6. Fall Risk: increased risk, doing PT , uses a cane, prevention discussed , see AVS 7. home Safety: does feel safe at home  8. Height, weight, & visual acuity: see VS, sees eye doctor regulalrly 9. Counseling: provided 10. Labs ordered based on risk factors: if needed  11. Referral Coordination: if needed 12. Care Plan, see assessment and plan , written personalized plan provided , see AVS 13. Cognitive Assessment: motor skills slt decreased for age,  cognition appropriate for age 68. Care team updated, sees dermatology sometimes   15. End-of-life care discussed   In addition, today we discussed the following: Anxiety, on Ativan very seldom Insomnia, takes doxepin rarely High cholesterol, good compliance with Lipitor Hypertension, good compliance with carvedilol sometimes gets slightly dizzy, wonders if it is too much carvedilol.    Review of Systems  Constitutional: No fever. No chills. No unexplained wt changes. No unusual sweats  HEENT: No dental problems, no ear discharge, no facial swelling, no voice changes. No eye discharge, no eye  redness , no  intolerance to light   Respiratory: No wheezing , no  difficulty breathing. No cough , no mucus production  Cardiovascular: No CP, no leg swelling , no  Palpitations  GI: no nausea, no vomiting, no diarrhea , no  abdominal pain.  No blood in the stools. No dysphagia, no odynophagia    Endocrine: No polyphagia, no polyuria , no polydipsia  GU: No dysuria, gross hematuria, difficulty urinating. + urinary urgency, no  frequency.  Musculoskeletal: No joint swellings or unusual aches or pains  Skin: No change in the color of the skin, palor , no  Rash  Allergic, immunologic: No environmental allergies , no  food allergies  Neurological: occ  dizziness no  syncope. No headaches. No diplopia, no slurred, no slurred speech, no motor deficits, no facial  Numbness  Hematological: No enlarged lymph nodes, no easy bruising , no unusual bleedings  Psychiatry: No suicidal ideas, no hallucinations, no beavior problems, no confusion.  No unusual/severe anxiety, no depression   Past Medical History  Diagnosis Date  . Hyperlipidemia   . Hypertension     Past Surgical History  Procedure Laterality Date  . Colonoscopy  2012     negative  . Appendectomy    . Lumbar disc surgery      Dr Shellia Carwin  . Skin biopsy      History   Social History  . Marital Status: Divorced    Spouse Name: N/A  . Number of Children: 3  . Years of Education: N/A   Occupational History  . 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 Smoking status: Former Smoker    Quit date: 02/22/1994  . Smokeless tobacco: Not on file     Comment: smoked 1953-1996, up to 1.5 ppd  . Alcohol Use: No     Comment:  quit 1985  . Drug Use: No  . Sexual Activity: Not on file   Other Topics  Concern  . Not on file   Social History Narrative   7 Gc, 3 GGchildren   lives by himself -- independent    Family History  Problem Relation Age of Onset  . Cancer Mother     pancreatic  . Diabetes Neg Hx   . Heart disease Neg Hx   . Stroke Neg Hx   . Colon cancer Neg Hx   . Prostate cancer Neg Hx          Medication List       This list is accurate as of: 07/23/14  6:17 PM.  Always use your most recent med list.               Doroteo Glassman  by Does not apply route.     aspirin 81 MG tablet  Take 81 mg by mouth daily.     atorvastatin 20 MG tablet  Commonly known as:  LIPITOR  Take 1  tablet (20 mg total) by mouth daily.     carvedilol 6.25 MG tablet  Commonly known as:  COREG  Take 1 tablet (6.25 mg total) by mouth 2 (two) times daily with a meal.     cyclobenzaprine 5 MG tablet  Commonly known as:  FLEXERIL  Take 5 mg by mouth at bedtime as needed for muscle spasms.     doxepin 10 MG capsule  Commonly known as:  SINEQUAN  Take 10 mg by mouth at bedtime as needed. for sleep     furosemide 20 MG tablet  Commonly known as:  LASIX  Take 2 tablets (40 mg total) by mouth daily.     HYDROcodone-acetaminophen 5-325 MG per tablet  Commonly known as:  NORCO  Take 1 tablet by mouth every 6 (six) hours as needed for moderate pain.     LORazepam 0.5 MG tablet  Commonly known as:  ATIVAN  Take 0.5 mg by mouth every 4 (four) hours as needed for anxiety.     tamsulosin 0.4 MG Caps capsule  Commonly known as:  FLOMAX  Take 1 capsule (0.4 mg total) by mouth daily.     tiotropium 18 MCG inhalation capsule  Commonly known as:  SPIRIVA  Place 1 capsule (18 mcg total) into inhaler and inhale daily.           Objective:   Physical Exam BP 128/76 mmHg  Pulse 72  Temp(Src) 97.8 F (36.6 C) (Oral)  Ht 5\' 1"  (1.549 m)  Wt 225 lb 4 oz (102.173 kg)  BMI 42.58 kg/m2  SpO2 94%  General:   Well developed, well nourished . NAD.  Neck:  Full range of motion. Supple. No  thyromegaly HEENT:  Normocephalic . Face symmetric, atraumatic Lungs:  Decrease breath sounds Normal respiratory effort, no intercostal retractions, no accessory muscle use. Heart: RRR,  no murmur.  +/+++ pretibial edema bilaterally  Abdomen:  Not distended, soft, non-tender. No rebound or rigidity.   Skin: Exposed areas without rash. Not pale. Not jaundice Neurologic:  alert & oriented X3.  Speech normal, gait difficult and assisted by a cane Strength symmetric and appropriate for age.  Psych: Cognition and judgment appear intact.  Cooperative with normal attention span and concentration.    Behavior appropriate. No anxious or depressed appearing.       Assessment & Plan:    diabetes, check A1c  Pain well-controlled with hydrocodone approximately once daily

## 2014-07-23 NOTE — Assessment & Plan Note (Signed)
Undergoing physical therapy

## 2014-07-23 NOTE — Progress Notes (Signed)
Pre visit review using our clinic review tool, if applicable. No additional management support is needed unless otherwise documented below in the visit note. 

## 2014-07-23 NOTE — Assessment & Plan Note (Addendum)
Former smoker,  dyspnea now resolved, on exam he has decreased breath sounds.  Plan: Continue Spiriva prn, consider check PFTs, likely has mild COPD but is now asx

## 2014-07-23 NOTE — Patient Instructions (Addendum)
Get your blood work before you leave   Check the  blood pressure 2 or 3 times a week,    Be sure your blood pressure is between 110/65 and  145/85.  if it is consistently higher or lower, let me know    Also check your blood pressure when you feel dizzy       Fall Prevention and Home Safety Falls cause injuries and can affect all age groups. It is possible to use preventive measures to significantly decrease the likelihood of falls. There are many simple measures which can make your home safer and prevent falls. OUTDOORS  Repair cracks and edges of walkways and driveways.  Remove high doorway thresholds.  Trim shrubbery on the main path into your home.  Have good outside lighting.  Clear walkways of tools, rocks, debris, and clutter.  Check that handrails are not broken and are securely fastened. Both sides of steps should have handrails.  Have leaves, snow, and ice cleared regularly.  Use sand or salt on walkways during winter months.  In the garage, clean up grease or oil spills. BATHROOM  Install night lights.  Install grab bars by the toilet and in the tub and shower.  Use non-skid mats or decals in the tub or shower.  Place a plastic non-slip stool in the shower to sit on, if needed.  Keep floors dry and clean up all water on the floor immediately.  Remove soap buildup in the tub or shower on a regular basis.  Secure bath mats with non-slip, double-sided rug tape.  Remove throw rugs and tripping hazards from the floors. BEDROOMS  Install night lights.  Make sure a bedside light is easy to reach.  Do not use oversized bedding.  Keep a telephone by your bedside.  Have a firm chair with side arms to use for getting dressed.  Remove throw rugs and tripping hazards from the floor. KITCHEN  Keep handles on pots and pans turned toward the center of the stove. Use back burners when possible.  Clean up spills quickly and allow time for drying.  Avoid  walking on wet floors.  Avoid hot utensils and knives.  Position shelves so they are not too high or low.  Place commonly used objects within easy reach.  If necessary, use a sturdy step stool with a grab bar when reaching.  Keep electrical cables out of the way.  Do not use floor polish or wax that makes floors slippery. If you must use wax, use non-skid floor wax.  Remove throw rugs and tripping hazards from the floor. STAIRWAYS  Never leave objects on stairs.  Place handrails on both sides of stairways and use them. Fix any loose handrails. Make sure handrails on both sides of the stairways are as long as the stairs.  Check carpeting to make sure it is firmly attached along stairs. Make repairs to worn or loose carpet promptly.  Avoid placing throw rugs at the top or bottom of stairways, or properly secure the rug with carpet tape to prevent slippage. Get rid of throw rugs, if possible.  Have an electrician put in a light switch at the top and bottom of the stairs. OTHER FALL PREVENTION TIPS  Wear low-heel or rubber-soled shoes that are supportive and fit well. Wear closed toe shoes.  When using a stepladder, make sure it is fully opened and both spreaders are firmly locked. Do not climb a closed stepladder.  Add color or contrast paint or tape to  grab bars and handrails in your home. Place contrasting color strips on first and last steps.  Learn and use mobility aids as needed. Install an electrical emergency response system.  Turn on lights to avoid dark areas. Replace light bulbs that burn out immediately. Get light switches that glow.  Arrange furniture to create clear pathways. Keep furniture in the same place.  Firmly attach carpet with non-skid or double-sided tape.  Eliminate uneven floor surfaces.  Select a carpet pattern that does not visually hide the edge of steps.  Be aware of all pets. OTHER HOME SAFETY TIPS  Set the water temperature for 120 F  (48.8 C).  Keep emergency numbers on or near the telephone.  Keep smoke detectors on every level of the home and near sleeping areas. Document Released: 01/29/2002 Document Revised: 08/10/2011 Document Reviewed: 04/30/2011 [facility] Patient Information 2015 [place], [place]. This information is not intended to replace advice given to you by your health care provider. Make sure you discuss any questions you have with your health care provider.   Preventive Care for Adults Ages 22 and over  Blood pressure check.** / Every 1 to 2 years.  Lipid and cholesterol check.**/ Every 5 years beginning at age 72.  Lung cancer screening. / Every year if you are aged 31-80 years and have a 30-pack-year history of smoking and currently smoke or have quit within the past 15 years. Yearly screening is stopped once you have quit smoking for at least 15 years or develop a health problem that would prevent you from having lung cancer treatment.  Fecal occult blood test (FOBT) of stool. / Every year beginning at age 1 and continuing until age 51. You may not have to do this test if you get a colonoscopy every 10 years.  Flexible sigmoidoscopy** or colonoscopy.** / Every 5 years for a flexible sigmoidoscopy or every 10 years for a colonoscopy beginning at age 58 and continuing until age 31.  Hepatitis C blood test.** / For all people born from [birth year] through 1965 and any individual with known risks for hepatitis C.  Abdominal aortic aneurysm (AAA) screening.** / A one-time screening for ages 24 to 88 years who are current or former smokers.  Skin self-exam. / Monthly.  Influenza vaccine. / Every year.  Tetanus, diphtheria, and acellular pertussis (Tdap/Td) vaccine.** / 1 dose of Td every 10 years.  Varicella vaccine.** / Consult your health care provider.  Zoster vaccine.** / 1 dose for adults aged 67 years or older.  Pneumococcal 13-valent conjugate (PCV13) vaccine.** / Consult your health care  provider.  Pneumococcal polysaccharide (PPSV23) vaccine.** / 1 dose for all adults aged 69 years and older.  Meningococcal vaccine.** / Consult your health care provider.  Hepatitis A vaccine.** / Consult your health care provider.  Hepatitis B vaccine.** / Consult your health care provider.  Haemophilus influenzae type b (Hib) vaccine.** / Consult your health care provider. **Family history and personal history of risk and conditions may change your health care provider's recommendations. Document Released: 04/06/2001 Document Revised: 02/13/2013 Document Reviewed: 07/06/2010 [facility] Patient Information 2015 [place], [place]. This information is not intended to replace advice given to you by your health care provider. Make sure you discuss any questions you have with your health care provider.

## 2014-07-23 NOTE — Assessment & Plan Note (Signed)
Occasionally gets dizzy, wonders if related to carvedilol. Recommend to check BP and pulse whenever he feels dizzy

## 2014-07-23 NOTE — Assessment & Plan Note (Signed)
Mild insomnia and anxiety well controlled with a sporadic use of doxepin and ativan get a UDS

## 2014-07-23 NOTE — Assessment & Plan Note (Signed)
History of B12 deficiency, check labs

## 2014-07-23 NOTE — Telephone Encounter (Signed)
Pt is requested refill on Lorazepam at [facility] today. UDS pending. Please advise.

## 2014-07-23 NOTE — Assessment & Plan Note (Addendum)
td 2012 pnm 2012 prevnar today zostavax 2009  Last colonoscopy 2008, adenoma, GI send a letter in 2013, he is due for colonoscopy, pros-cons discussed,he is quite reluctant to proceed, eventually decided to not pursue further screenings . Prostate cancer screening, patient not interested in further prostate cancer screening.  Diet and exercise discussed

## 2014-07-24 ENCOUNTER — Telehealth: Payer: Self-pay | Admitting: Internal Medicine

## 2014-07-24 MED ORDER — LORAZEPAM 0.5 MG PO TABS: 0.5000 mg | ORAL_TABLET | ORAL | Status: DC | PRN

## 2014-07-24 NOTE — Telephone Encounter (Signed)
error 

## 2014-07-24 NOTE — Telephone Encounter (Signed)
[facility]  

## 2014-07-24 NOTE — Telephone Encounter (Signed)
Ok 40

## 2014-07-24 NOTE — Telephone Encounter (Signed)
Rx printed, awaiting MD signature.  

## 2014-07-25 MED ORDER — POTASSIUM CHLORIDE ER 10 MEQ PO TBCR: 10.0000 meq | EXTENDED_RELEASE_TABLET | Freq: Every day | ORAL | Status: DC

## 2014-07-25 NOTE — Addendum Note (Signed)
Addended by: Wilfrid Lund on: 07/25/2014 09:41 AM   Modules accepted: Orders

## 2014-07-29 ENCOUNTER — Telehealth: Payer: Self-pay | Admitting: Internal Medicine

## 2014-07-29 NOTE — Telephone Encounter (Signed)
Caller name: Altha Harm from [facility]  Relation to pt: Physical Therapist  Call back number: [phone]  [id number] [place], [place]  Reason for call:  PT requesting a refill LORazepam (ATIVAN) 0.5 MG tablet

## 2014-07-29 NOTE — Telephone Encounter (Signed)
[facility] [address] on 07[facility]24/2014. [facility]

## 2014-08-02 ENCOUNTER — Telehealth: Payer: Self-pay

## 2014-08-02 NOTE — Telephone Encounter (Signed)
UDS: 07/23/2014  Positive for Norco Positive for Doxepin   Low risk per Dr. Larose Kells 08/02/2014

## 2014-08-16 ENCOUNTER — Telehealth: Payer: Self-pay | Admitting: Medical

## 2014-08-16 NOTE — Telephone Encounter (Signed)
Pt had a question on his bp. Carvedilol was decreased per note when he was hospitalized. I wrote it as bid since his bp was elevated.  I instructed him to take bid if his bp was increasing. He is here today asking receptionist staff how he should take. Will explain again if his bp trend is over 140/90 then take twice daily.

## 2014-08-23 ENCOUNTER — Telehealth: Payer: Self-pay | Admitting: Internal Medicine

## 2014-08-23 MED ORDER — HYDROCODONE-ACETAMINOPHEN 5-325 MG PO TABS: 1.0000 | ORAL_TABLET | Freq: Four times a day (QID) | ORAL | Status: DC | PRN

## 2014-08-23 NOTE — Telephone Encounter (Signed)
Rx printed for July and August 2016, awaiting MD signature.

## 2014-08-23 NOTE — Telephone Encounter (Signed)
Patient needs to pick up his hydrocodone/ acetaminaphine today as we will be closed on Monday.

## 2014-08-23 NOTE — Telephone Encounter (Signed)
Pt is requesting refill on Hydrocodone. Pt is needing today since we will be closed on Monday for July 4.   Last OV: 07/23/2014 Last Fill: 07/08/2014 #20 0RF UDS: 07/23/2014 Low risk   Please advise.

## 2014-08-23 NOTE — Telephone Encounter (Signed)
Please inform Pt that Rx has been placed at front desk for pick up at his convenience. Thank you.  

## 2014-08-23 NOTE — Telephone Encounter (Signed)
Okay #60, 2 prescriptions 

## 2014-08-27 ENCOUNTER — Other Ambulatory Visit: Payer: Self-pay

## 2014-08-27 ENCOUNTER — Other Ambulatory Visit (INDEPENDENT_AMBULATORY_CARE_PROVIDER_SITE_OTHER): Payer: PPO

## 2014-08-27 DIAGNOSIS — I10 Essential (primary) hypertension: Secondary | ICD-10-CM

## 2014-08-27 LAB — BASIC METABOLIC PANEL
BUN: 17 mg/dL (ref 6–23)
CHLORIDE: 103 meq/L (ref 96–112)
CO2: 29 mEq/L (ref 19–32)
CREATININE: 0.58 mg/dL (ref 0.40–1.50)
Calcium: 9.6 mg/dL (ref 8.4–10.5)
GFR: 143.64 mL/min (ref >60.00)
GLUCOSE: 110 mg/dL — AB (ref 70–99)
POTASSIUM: 4 meq/L (ref 3.5–5.1)
Sodium: 142 mEq/L (ref 135–145)

## 2014-08-27 MED ORDER — POTASSIUM CHLORIDE ER 10 MEQ PO TBCR: 10.0000 meq | EXTENDED_RELEASE_TABLET | Freq: Every day | ORAL | Status: DC

## 2014-08-28 ENCOUNTER — Encounter: Payer: Self-pay | Admitting: Internal Medicine

## 2014-08-28 ENCOUNTER — Ambulatory Visit (INDEPENDENT_AMBULATORY_CARE_PROVIDER_SITE_OTHER): Payer: PPO | Admitting: Internal Medicine

## 2014-08-28 VITALS — BP 108/58 | HR 67 | Temp 98.2°F | Ht 61.0 in | Wt 220.0 lb

## 2014-08-28 DIAGNOSIS — M545 Low back pain: Secondary | ICD-10-CM

## 2014-08-28 MED ORDER — CYCLOBENZAPRINE HCL 5 MG PO TABS: 5.0000 mg | ORAL_TABLET | Freq: Every evening | ORAL | Status: DC | PRN

## 2014-08-28 MED ORDER — ATORVASTATIN CALCIUM 20 MG PO TABS: 20.0000 mg | ORAL_TABLET | Freq: Every day | ORAL | Status: DC

## 2014-08-28 MED ORDER — FUROSEMIDE 20 MG PO TABS: 40.0000 mg | ORAL_TABLET | Freq: Every day | ORAL | Status: DC

## 2014-08-28 MED ORDER — LORAZEPAM 0.5 MG PO TABS: 0.5000 mg | ORAL_TABLET | ORAL | Status: DC | PRN

## 2014-08-28 MED ORDER — TAMSULOSIN HCL 0.4 MG PO CAPS: 0.4000 mg | ORAL_CAPSULE | Freq: Every day | ORAL | Status: DC

## 2014-08-28 NOTE — Assessment & Plan Note (Signed)
Chronic back pain at baseline. Also complaining today of bilateral leg pain from the knees down, pain is chronic, apparently not worse or better. No evidence of vascular disease or neuropathy on clinical grounds. Recommend conservative treatment with Tylenol and hydrocodone

## 2014-08-28 NOTE — Progress Notes (Signed)
Subjective:    Patient ID: Wayne Nash, male    DOB: 07-Oct-1933, 81 y.o.   MRN: [id number]  DOS:  08/28/2014 Type of visit - description : acute, CC "leg hurt" Interval history: Reports B leg pain from the knee down x long time, unable to tell if getting worse . Takes Tylenol occasionally. Pain is described as an "aggravating toothache"   Review of Systems Denies claudication, pain is not worse at night,  back pain at this point is at baseline. Symptoms not better by shaking his leg or walking Pain is not described as burning  Past Medical History  Diagnosis Date  . Hyperlipidemia   . Hypertension     Past Surgical History  Procedure Laterality Date  . Colonoscopy  2012     negative  . Appendectomy    . Lumbar disc surgery      Dr Shellia Carwin  . Skin biopsy      History   Social History  . Marital Status: Divorced    Spouse Name: N/A  . Number of Children: 3  . Years of Education: N/A   Occupational History  . 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 Smoking status: Former Smoker    Quit date: 02/22/1994  . Smokeless tobacco: Not on file     Comment: smoked 1953-1996, up to 1.5 ppd  . Alcohol Use: No     Comment:  quit 1985  . Drug Use: No  . Sexual Activity: Not on file   Other Topics Concern  . Not on file   Social History Narrative   7 Gc, 3 GGchildren   lives by himself -- independent         Medication List       This list is accurate as of: 08/28/14  5:28 PM.  Always use your most recent med list.               Doroteo Glassman  by Does not apply route.     aspirin 81 MG tablet  Take 81 mg by mouth daily.     atorvastatin 20 MG tablet  Commonly known as:  LIPITOR  Take 1 tablet (20 mg total) by mouth daily.     carvedilol 6.25 MG tablet  Commonly known as:  COREG  Take 1 tablet (6.25 mg total) by mouth 2 (two) times daily with a meal.     cyclobenzaprine 5 MG tablet  Commonly known  as:  FLEXERIL  Take 1 tablet (5 mg total) by mouth at bedtime as needed for muscle spasms.     doxepin 10 MG capsule  Commonly known as:  SINEQUAN  Take 10 mg by mouth at bedtime as needed. for sleep     furosemide 20 MG tablet  Commonly known as:  LASIX  Take 2 tablets (40 mg total) by mouth daily.     HYDROcodone-acetaminophen 5-325 MG per tablet  Commonly known as:  NORCO  Take 1 tablet by mouth every 6 (six) hours as needed for moderate pain.     LORazepam 0.5 MG tablet  Commonly known as:  ATIVAN  Take 1 tablet (0.5 mg total) by mouth every 4 (four) hours as needed.     potassium chloride 10 MEQ tablet  Commonly known as:  K-DUR  Take 1 tablet (10 mEq total) by mouth daily.     tamsulosin 0.4 MG Caps capsule  Commonly known as:  FLOMAX  Take 1 capsule (0.4 mg total) by mouth daily.     tiotropium 18 MCG inhalation capsule  Commonly known as:  SPIRIVA  Place 1 capsule (18 mcg total) into inhaler and inhale daily.           Objective:   Physical Exam BP 108/58 mmHg  Pulse 67  Temp(Src) 98.2 F (36.8 C) (Oral)  Ht 5\' 1"  (1.549 m)  Wt 220 lb (99.791 kg)  BMI 41.59 kg/m2  SpO2 96%  General:   Well developed, well nourished . NAD.  HEENT:  Normocephalic . Face symmetric, atraumatic LE: No rash, mild edema at baseline, good pedal and femoral pulses.  Calves symmetric and not TTP. Skin: Not pale. Not jaundice Neurologic:  alert & oriented X3.  Speech normal, gait assisted  by a cane, slow and limited, at baseline  DTRs: Absent at the right knee and ankles, not a new  finding per pt. Positive DTR at the left knee Psych--  Cognition and judgment appear intact.  Cooperative with normal attention span and concentration.  Behavior appropriate. No anxious or depressed appearing.      Assessment & Plan:

## 2014-08-28 NOTE — Patient Instructions (Signed)
Take Tylenol 500 mg 2 tablets twice a day as needed

## 2014-08-28 NOTE — Progress Notes (Signed)
Pre visit review using our clinic review tool, if applicable. No additional management support is needed unless otherwise documented below in the visit note. 

## 2014-09-05 ENCOUNTER — Telehealth: Payer: Self-pay | Admitting: *Deleted

## 2014-09-05 NOTE — Telephone Encounter (Signed)
Home health certification and plan of care received via mail from [facility]. Forwarded to Dr. Larose Kells. JG//CMA

## 2014-09-16 ENCOUNTER — Telehealth: Payer: Self-pay | Admitting: *Deleted

## 2014-09-16 NOTE — Telephone Encounter (Signed)
PA for cyclobenzaprine initiated. Awaiting determination. JG//CMA

## 2014-09-17 NOTE — Telephone Encounter (Signed)
PA approved effective 09/16/2014 through 02/22/2015. Approval letter sent for scanning. JG/CMA

## 2014-09-26 ENCOUNTER — Telehealth: Payer: Self-pay | Admitting: Internal Medicine

## 2014-09-26 NOTE — Telephone Encounter (Signed)
Caller name: Ronah @ [facility] [employer] Can be reached: [phone]  Reason for call: Pt called them for RX of lidocaine ointment for knee pain. They are requesting call back with verbal order if approved.

## 2014-09-26 NOTE — Telephone Encounter (Signed)
Please advise 

## 2014-09-27 MED ORDER — LIDOCAINE 5 % EX PTCH: 1.0000 | MEDICATED_PATCH | Freq: Every day | CUTANEOUS | Status: DC | PRN

## 2014-09-27 NOTE — Telephone Encounter (Signed)
I don't believe I have prescribed that before. I prefer lidocaine patches. Call #[phone], no refill. One or 2  Patches  Daily prn

## 2014-09-27 NOTE — Telephone Encounter (Signed)
Lidocaine patches sent to [facility] as requested.

## 2014-10-04 ENCOUNTER — Other Ambulatory Visit: Payer: Self-pay

## 2014-10-11 ENCOUNTER — Other Ambulatory Visit: Payer: Self-pay | Admitting: Internal Medicine

## 2014-10-11 ENCOUNTER — Telehealth: Payer: Self-pay | Admitting: Internal Medicine

## 2014-10-11 MED ORDER — DOXEPIN HCL 10 MG PO CAPS: 10.0000 mg | ORAL_CAPSULE | Freq: Every evening | ORAL | Status: DC | PRN

## 2014-10-11 NOTE — Telephone Encounter (Signed)
[facility] [address]

## 2014-10-11 NOTE — Telephone Encounter (Signed)
Pt requesting refill on Doxepin. Last OV 08/28/2014. Currently on Pt's medication list, never filled by [place], or anyone at [facility] as far as I can tell. Have reviewed several months of OV notes and do not see where Pt was ever given Doxepin. Please advise.

## 2014-10-11 NOTE — Telephone Encounter (Signed)
Rx printed, awaiting MD signature.  

## 2014-10-11 NOTE — Telephone Encounter (Signed)
Caller name: Wiliam Relationship to patient: self Can be reached:  [facility] [address] Reason for call:doxipen  Needs refill

## 2014-10-11 NOTE — Telephone Encounter (Signed)
Spoke with Pt, informed him that Lorazepam and Doxepin has both [facility]. Pt verbalized understanding.

## 2014-10-11 NOTE — Telephone Encounter (Signed)
Please call the patient and clarify: who prescribed this medication and for what purpose. May have been dermatology for itching.

## 2014-10-11 NOTE — Telephone Encounter (Signed)
Spoke with Pt, he informed me that he believes Dr. Linna Darner had originally given Doxepin to him several years ago. He stated that he has been using what he had left which is expired because the Lorazepam by itself doesn't help him sleep at night. I informed him I would speak with Dr. Larose Kells and let him know if refilled.

## 2014-10-11 NOTE — Telephone Encounter (Signed)
[facility]  

## 2014-10-11 NOTE — Telephone Encounter (Signed)
Agree, chart reviewed, takes doxepin as needed for insomnia: Okay to call #30 and 3 refills

## 2014-10-11 NOTE — Telephone Encounter (Signed)
Pt is requesting refill on Lorazepam.  Last OV: 08/28/2014 Last Fill: 08/28/2014 #40 0RF UDS: 07/23/2014 Low risk  Please advise.

## 2014-10-11 NOTE — Telephone Encounter (Signed)
Okay lorazepam No. 60 and 2 refill

## 2014-10-14 NOTE — Telephone Encounter (Signed)
PA for doxepin initiated. Awaiting determination. JG//CMA

## 2014-10-16 NOTE — Telephone Encounter (Signed)
Approved effective 10/15/2014 - 02/22/2015. Approval letter sent for scanning. JG/CMA

## 2014-10-21 ENCOUNTER — Telehealth: Payer: Self-pay | Admitting: Medical

## 2014-10-21 DIAGNOSIS — R911 Solitary pulmonary nodule: Secondary | ICD-10-CM

## 2014-10-21 NOTE — Telephone Encounter (Signed)
Ct chest repeat. Would you get this scheduled. Thanks.

## 2014-10-22 ENCOUNTER — Ambulatory Visit (HOSPITAL_BASED_OUTPATIENT_CLINIC_OR_DEPARTMENT_OTHER)
Admission: RE | Admit: 2014-10-22 | Discharge: 2014-10-22 | Disposition: A | Discharge status: Home / Self Care | Payer: PPO | Source: Ambulatory Visit | Attending: Medical | Admitting: Medical

## 2014-10-22 DIAGNOSIS — I2510 Atherosclerotic heart disease of native coronary artery without angina pectoris: Secondary | ICD-10-CM | POA: Insufficient documentation

## 2014-10-22 DIAGNOSIS — R911 Solitary pulmonary nodule: Secondary | ICD-10-CM

## 2014-11-06 ENCOUNTER — Telehealth: Payer: Self-pay | Admitting: Internal Medicine

## 2014-11-06 MED ORDER — HYDROCODONE-ACETAMINOPHEN 5-325 MG PO TABS: 1.0000 | ORAL_TABLET | Freq: Four times a day (QID) | ORAL | Status: DC | PRN

## 2014-11-06 NOTE — Telephone Encounter (Signed)
Pt requesting refill on hydrocodone. He has 6 left. Takes 2-3/day. Please call pt when RX is ready to pick up. Best [phone].

## 2014-11-06 NOTE — Telephone Encounter (Signed)
Pt is requesting refill on Hydrocodone.  Last OV: 08/28/2014 Last Fill: 08/23/2014 #60 0RF For August UDS: 07/23/2014 Low risk

## 2014-11-06 NOTE — Telephone Encounter (Signed)
[place]  #60, 2 prescriptions

## 2014-11-06 NOTE — Telephone Encounter (Signed)
LMOM informing Pt that Rx's have been placed at front desk for pick up at his convenience.  

## 2014-11-06 NOTE — Telephone Encounter (Signed)
Rx's for September and October 2016 printed, awaiting MD signature.

## 2014-12-18 ENCOUNTER — Other Ambulatory Visit: Payer: Self-pay | Admitting: Medical

## 2014-12-18 ENCOUNTER — Other Ambulatory Visit: Payer: Self-pay | Admitting: Internal Medicine

## 2014-12-18 NOTE — Telephone Encounter (Signed)
Pt is requesting Flexeril.  Last OV: 08/28/2014, no future appt scheduled Last Fill: 08/28/2014 #30 and 1RF UDS: 07/23/2014 Low risk  Please advise.

## 2014-12-18 NOTE — Telephone Encounter (Signed)
Rx sent 

## 2014-12-18 NOTE — Telephone Encounter (Signed)
Okay #30 and 2 RF

## 2014-12-23 ENCOUNTER — Telehealth: Payer: Self-pay | Admitting: Internal Medicine

## 2014-12-23 MED ORDER — HYDROCODONE-ACETAMINOPHEN 5-325 MG PO TABS: 1.0000 | ORAL_TABLET | Freq: Four times a day (QID) | ORAL | Status: DC | PRN

## 2014-12-23 NOTE — Telephone Encounter (Signed)
Please inform Pt that his Rx has been placed at the front desk for pick-up at his convenience. Also, Pt is due for routine follow-up (30 minutes). Please have Pt schedule. Thank you.

## 2014-12-23 NOTE — Telephone Encounter (Signed)
°  Relation to YO:[id number] Call back number:[phone] Pharmacy:  Reason for call: pt is needing rx hydrocodone, please call when available for pick up.

## 2014-12-23 NOTE — Telephone Encounter (Signed)
ok #60, no refills. Due for a routine office visit. Please arrange

## 2014-12-23 NOTE — Telephone Encounter (Signed)
Pt is requesting refill on Hydrocodone.  Last OV: 08/28/2014, no future appt scheduled Last Fill: 11/06/2014 #60 and 0RF For September and October 2016 UDS: 07/23/2014 Low risk  Please advise.

## 2014-12-23 NOTE — Telephone Encounter (Signed)
Rx for November 2016, printed, awaiting MD signature.

## 2015-01-02 ENCOUNTER — Other Ambulatory Visit: Payer: Self-pay

## 2015-01-07 ENCOUNTER — Ambulatory Visit (INDEPENDENT_AMBULATORY_CARE_PROVIDER_SITE_OTHER): Payer: PPO | Admitting: Internal Medicine

## 2015-01-07 ENCOUNTER — Encounter: Payer: Self-pay | Admitting: Internal Medicine

## 2015-01-07 VITALS — BP 128/58 | HR 69 | Temp 97.8°F | Ht 61.0 in | Wt 230.0 lb

## 2015-01-07 DIAGNOSIS — Z09 Encounter for follow-up examination after completed treatment for conditions other than malignant neoplasm: Secondary | ICD-10-CM

## 2015-01-07 DIAGNOSIS — I10 Essential (primary) hypertension: Secondary | ICD-10-CM | POA: Diagnosis not present

## 2015-01-07 DIAGNOSIS — M545 Low back pain: Secondary | ICD-10-CM | POA: Diagnosis not present

## 2015-01-07 DIAGNOSIS — E538 Deficiency of other specified B group vitamins: Secondary | ICD-10-CM

## 2015-01-07 LAB — BASIC METABOLIC PANEL
BUN: 17 mg/dL (ref 6–23)
CHLORIDE: 101 meq/L (ref 96–112)
CO2: 33 meq/L — AB (ref 19–32)
CREATININE: 0.58 mg/dL (ref 0.40–1.50)
Calcium: 9.7 mg/dL (ref 8.4–10.5)
GFR: 143.51 mL/min (ref >60.00)
Glucose, Bld: 106 mg/dL — ABNORMAL HIGH (ref 70–99)
POTASSIUM: 4.2 meq/L (ref 3.5–5.1)
Sodium: 141 mEq/L (ref 135–145)

## 2015-01-07 LAB — FOLATE: FOLATE: 8.4 ng/mL (ref >5.9)

## 2015-01-07 LAB — VITAMIN B12: VITAMIN B 12: 273 pg/mL (ref 211–911)

## 2015-01-07 MED ORDER — HYDROCODONE-ACETAMINOPHEN 7.5-300 MG PO TABS: 1.0000 | ORAL_TABLET | Freq: Three times a day (TID) | ORAL | Status: DC | PRN

## 2015-01-07 MED ORDER — GABAPENTIN 300 MG PO CAPS: 300.0000 mg | ORAL_CAPSULE | Freq: Two times a day (BID) | ORAL | Status: DC

## 2015-01-07 NOTE — Progress Notes (Signed)
Pre visit review using our clinic review tool, if applicable. No additional management support is needed unless otherwise documented below in the visit note. 

## 2015-01-07 NOTE — Progress Notes (Signed)
Subjective:    Patient ID: Wayne Nash, male    DOB: 1933-04-22, 81 y.o.   MRN: [id number]  DOS:  01/07/2015 Type of visit - description : Routine checkup, here with his granddaughter Interval history: His chief complaint today is pain, no history of back pain associated with leg pain bilaterally. The pain in the legs is constant, described as a toothache, day or night, no worse with walking. HTN, edema: Seems to be well-controlled.    Review of Systems  denies bladder or bowel incontinence.   Past Medical History  Diagnosis Date  . Hyperlipidemia   . Hypertension     Past Surgical History  Procedure Laterality Date  . Colonoscopy  2012     negative  . Appendectomy    . Lumbar disc surgery      Dr Shellia Carwin  . Skin biopsy      Social History   Social History  . Marital Status: Divorced    Spouse Name: N/A  . Number of Children: 3  . Years of Education: N/A   Occupational History  . 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 Smoking status: Former Smoker    Quit date: 02/22/1994  . Smokeless tobacco: Not on file     Comment: smoked 1953-1996, up to 1.5 ppd  . Alcohol Use: No     Comment:  quit 1985  . Drug Use: No  . Sexual Activity: Not on file   Other Topics Concern  . Not on file   Social History Narrative   7 Gc, 3 GGchildren   lives by himself -- independent         Medication List       This list is accurate as of: 01/07/15  5:36 PM.  Always use your most recent med list.               aspirin 81 MG tablet  Take 81 mg by mouth daily.     atorvastatin 20 MG tablet  Commonly known as:  LIPITOR  Take 1 tablet (20 mg total) by mouth daily.     carvedilol 6.25 MG tablet  Commonly known as:  COREG  TAKE 1 TABLET BY MOUTH EVERY DAY     cyclobenzaprine 5 MG tablet  Commonly known as:  FLEXERIL  Take 1 tablet (5 mg total) by mouth at bedtime as needed for muscle spasms.     doxepin 10 MG  capsule  Commonly known as:  SINEQUAN  Take 1 capsule (10 mg total) by mouth at bedtime as needed (For Sleep).     furosemide 20 MG tablet  Commonly known as:  LASIX  Take 2 tablets (40 mg total) by mouth daily.     gabapentin 300 MG capsule  Commonly known as:  NEURONTIN  Take 1 capsule (300 mg total) by mouth 2 (two) times daily.     Hydrocodone-Acetaminophen 7.5-300 MG Tabs  Take 1 tablet by mouth 3 (three) times daily as needed.     lidocaine 5 % ointment  Commonly known as:  XYLOCAINE  Apply 1 application topically 2 (two) times daily as needed.     LORazepam 0.5 MG tablet  Commonly known as:  ATIVAN  Take 1 tablet (0.5 mg total) by mouth every 4 (four) hours as needed.     potassium chloride 10 MEQ tablet  Commonly known as:  K-DUR  Take 1 tablet (10 mEq total)  by mouth daily.     tamsulosin 0.4 MG Caps capsule  Commonly known as:  FLOMAX  Take 1 capsule (0.4 mg total) by mouth daily.     tiotropium 18 MCG inhalation capsule  Commonly known as:  SPIRIVA  Place 1 capsule (18 mcg total) into inhaler and inhale daily.           Objective:   Physical Exam BP 128/58 mmHg  Pulse 69  Temp(Src) 97.8 F (36.6 C) (Oral)  Ht 5\' 1"  (1.549 m)  Wt 230 lb (104.327 kg)  BMI 43.48 kg/m2  SpO2 94% General:   Well developed, well nourished . NAD.  HEENT:  Normocephalic . Face symmetric, atraumatic  LE: +/+++ B Good pedal pulses bilaterally. Pinprick examination: Slightly decreased and the second toe bilaterally Skin: Not pale. Not jaundice Neurologic:  alert & oriented X3.  Speech normal, gait appropriate for age and unassisted. Motor exam symmetric, DTRs decrease in their right leg, chronic finding. Straight leg test negative Psych--  Cognition and judgment appear intact.  Cooperative with normal attention span and concentration.  Behavior appropriate. No anxious or depressed appearing.      Assessment & Plan:   Assessment>  HTN Hyperlipidemia Anxiety,  insomnia B12 deficiency Chronic back pain on hydrocodone (UDS) Rash (persistent throughout 2015--scabies) Gait disorder Pulmonary Nodule, last CT  09-2014 recheck 1 year  H/o lithiasis  PLAN HTN, edema: On Lasix and potassium, check labs B12 deficiency: Recheck labs Chronic back pain: This was the main concern of the patient today, we had extensive discussion about treatment options with the patient and his granddaughter who is a Therapist, sports. Multiple questions answered to the best of my ability. On clinical grounds suspect spinal stenosis. Likely will need further evaluation including MRI , ? NCS. For pain management will introduce gabapentin, increase the dose of hydrocodone he takes --> watch for  drowsiness and side effects. Refer to orthopedic surgery. RTC 3 months  Today, I spent more than  25 min with the patient: >50% of the time counseling regards back: dx, treatment options, multiple questions answered to the best of my ability

## 2015-01-07 NOTE — Assessment & Plan Note (Signed)
HTN, edema: On Lasix and potassium, check labs B12 deficiency: Recheck labs Chronic back pain: This was the main concern of the patient today, we had extensive discussion about treatment options with the patient and his granddaughter who is a Therapist, sports. Multiple questions answered to the best of my ability. On clinical grounds suspect spinal stenosis. Likely will need further evaluation including MRI , ? NCS. For pain management will introduce gabapentin, increase the dose of hydrocodone he takes --> watch for  drowsiness and side effects. Refer to orthopedic surgery. RTC 3 months

## 2015-01-07 NOTE — Patient Instructions (Signed)
Get your blood work before you leave   For pain: If not severe take Tylenol OTC If the pain is more intense, instead of Tylenol take hydrocodone 3 times a day as needed. Will cause drowsiness  Also start gabapentin 300 mg: The first week, take 1 tablet at night, then one tablet twice a day    Next visit  for a  routine checkup in 3 months.  (30 minutes) Please schedule an appointment at the front desk

## 2015-01-22 ENCOUNTER — Other Ambulatory Visit: Payer: Self-pay | Admitting: Internal Medicine

## 2015-01-22 NOTE — Telephone Encounter (Signed)
Pt is requesting refill on Lorazepam.  Last OV: 01/07/2015 Last Fill: 10/11/2014 #60 and 2RF Pt sig: 1 tablet q4h PRN UDS: 07/23/2014 Low risk  Please advise.

## 2015-01-22 NOTE — Telephone Encounter (Signed)
[facility]  

## 2015-01-22 NOTE — Telephone Encounter (Signed)
Okay #60 and one refill 

## 2015-01-22 NOTE — Telephone Encounter (Signed)
Rx printed, awaiting MD signature.  

## 2015-02-07 ENCOUNTER — Telehealth: Payer: Self-pay | Admitting: Internal Medicine

## 2015-02-07 MED ORDER — HYDROCODONE-ACETAMINOPHEN 7.5-300 MG PO TABS: 1.0000 | ORAL_TABLET | Freq: Three times a day (TID) | ORAL | Status: DC | PRN

## 2015-02-07 NOTE — Telephone Encounter (Signed)
Please inform Pt that Rx is ready for pick up at the front desk at his convenience.

## 2015-02-07 NOTE — Telephone Encounter (Signed)
Rx for December 2016 and January 2017 printed, awaiting MD signature.

## 2015-02-07 NOTE — Telephone Encounter (Signed)
Okay #90, 2 prescriptions 

## 2015-02-07 NOTE — Telephone Encounter (Signed)
Pt is requesting refill on Hydrocodone.  Last OV: 01/07/2015 Last Fill: 01/07/2015 #90 and 0RF Pt sig: 1 tablet tid PRN UDS: 07/23/2014 Low risk  Please advise.

## 2015-02-07 NOTE — Telephone Encounter (Signed)
Relation to [id number] Call back [phone]   Reason for call:  Patient requesting a refill Hydrocodone-Acetaminophen 7.5-300 MG TABS

## 2015-02-11 NOTE — Telephone Encounter (Signed)
Patient informed. 

## 2015-02-14 ENCOUNTER — Other Ambulatory Visit: Payer: Self-pay | Admitting: Internal Medicine

## 2015-02-14 NOTE — Telephone Encounter (Signed)
Pt is requesting refill on Doxepin.  Last OV: 01/07/2015 Last Fill: 10/11/2014 #30 and 3RF  Please advise.

## 2015-02-14 NOTE — Telephone Encounter (Signed)
Rx sent 

## 2015-02-14 NOTE — Telephone Encounter (Signed)
Ok 30 and 5 RF 

## 2015-02-28 ENCOUNTER — Other Ambulatory Visit: Payer: Self-pay

## 2015-02-28 MED ORDER — FUROSEMIDE 20 MG PO TABS: 40.0000 mg | ORAL_TABLET | Freq: Every day | ORAL | Status: DC

## 2015-02-28 MED ORDER — POTASSIUM CHLORIDE ER 10 MEQ PO TBCR: 10.0000 meq | EXTENDED_RELEASE_TABLET | Freq: Every day | ORAL | Status: DC

## 2015-02-28 MED ORDER — TAMSULOSIN HCL 0.4 MG PO CAPS: 0.4000 mg | ORAL_CAPSULE | Freq: Every day | ORAL | Status: DC

## 2015-03-14 DIAGNOSIS — H524 Presbyopia: Secondary | ICD-10-CM | POA: Diagnosis not present

## 2015-03-14 DIAGNOSIS — H25813 Combined forms of age-related cataract, bilateral: Secondary | ICD-10-CM | POA: Diagnosis not present

## 2015-03-14 DIAGNOSIS — H5210 Myopia, unspecified eye: Secondary | ICD-10-CM | POA: Diagnosis not present

## 2015-03-21 ENCOUNTER — Telehealth: Payer: Self-pay | Admitting: Internal Medicine

## 2015-03-21 NOTE — Telephone Encounter (Signed)
Patient left message on VM stating that he needed to schedule an appt with Dr. Larose Kells about his kidneys. Called to schedule the appt for him and did not get an answer. Left message for him to call back and stay on the phone to speak with a scheduler.

## 2015-03-24 ENCOUNTER — Ambulatory Visit (INDEPENDENT_AMBULATORY_CARE_PROVIDER_SITE_OTHER): Payer: PPO | Admitting: Internal Medicine

## 2015-03-24 ENCOUNTER — Encounter: Payer: Self-pay | Admitting: Internal Medicine

## 2015-03-24 VITALS — BP 140/62 | HR 67 | Temp 97.6°F | Ht 61.0 in | Wt 229.8 lb

## 2015-03-24 DIAGNOSIS — M545 Low back pain: Secondary | ICD-10-CM

## 2015-03-24 DIAGNOSIS — N390 Urinary tract infection, site not specified: Secondary | ICD-10-CM | POA: Diagnosis not present

## 2015-03-24 DIAGNOSIS — R7303 Prediabetes: Secondary | ICD-10-CM

## 2015-03-24 LAB — HEMOGLOBIN A1C: Hgb A1c MFr Bld: 5.9 % (ref 4.6–6.5)

## 2015-03-24 LAB — PSA: PSA: 3.22 ng/mL (ref 0.10–4.00)

## 2015-03-24 MED ORDER — SULFAMETHOXAZOLE-TRIMETHOPRIM 800-160 MG PO TABS: 1.0000 | ORAL_TABLET | Freq: Two times a day (BID) | ORAL | Status: DC

## 2015-03-24 NOTE — Progress Notes (Signed)
Pre visit review using our clinic review tool, if applicable. No additional management support is needed unless otherwise documented below in the visit note. 

## 2015-03-24 NOTE — Progress Notes (Signed)
Subjective:    Patient ID: Wayne Nash, male    DOB: 1934-02-01, 82 y.o.   MRN: [id number]  DOS:  03/24/2015 Type of visit - description : Follow-up Interval history: Here for a follow-up, his main concern today is urinary symptoms. 3 days history of urinary urgency and frequency, needs to urinate almost every 1 hour.  HTN: good compliance with medication. Chronic edema: States is getting worse however looks about the same on today's exam, the patient states that his swollen legs hurt from the knees down. Chronic back pain: See previous visit, was referred to orthopedic surgery, did not go, states that the back pain is going on for years and not worse. On  hydrocodone up to 3 times a day as needed.  Wt Readings from Last 3 Encounters:  03/24/15 229 lb 12.8 oz (104.237 kg)  01/07/15 230 lb (104.327 kg)  08/28/14 220 lb (99.791 kg)      Review of Systems No fever or chills No abdominal or flank pain. No nausea vomiting No actual dysuria or gross hematuria.   Past Medical History  Diagnosis Date  . Hyperlipidemia   . Hypertension     Past Surgical History  Procedure Laterality Date  . Colonoscopy  2012     negative  . Appendectomy    . Lumbar disc surgery      Dr Shellia Carwin  . Skin biopsy      Social History   Social History  . Marital Status: Divorced    Spouse Name: N/A  . Number of Children: 3  . Years of Education: N/A   Occupational History  . 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 Smoking status: Former Smoker    Quit date: 02/22/1994  . Smokeless tobacco: Not on file     Comment: smoked 1953-1996, up to 1.5 ppd  . Alcohol Use: No     Comment:  quit 1985  . Drug Use: No  . Sexual Activity: Not on file   Other Topics Concern  . Not on file   Social History Narrative   7 Gc, 3 GGchildren   lives by himself -- independent         Medication List       This list is accurate as of: 03/24/15   5:32 PM.  Always use your most recent med list.               aspirin 81 MG tablet  Take 81 mg by mouth daily.     atorvastatin 20 MG tablet  Commonly known as:  LIPITOR  Take 1 tablet (20 mg total) by mouth daily.     carvedilol 6.25 MG tablet  Commonly known as:  COREG  TAKE 1 TABLET BY MOUTH EVERY DAY     cyclobenzaprine 5 MG tablet  Commonly known as:  FLEXERIL  Take 1 tablet (5 mg total) by mouth at bedtime as needed for muscle spasms.     doxepin 10 MG capsule  Commonly known as:  SINEQUAN  Take 1 capsule (10 mg total) by mouth at bedtime as needed.     furosemide 20 MG tablet  Commonly known as:  LASIX  Take 2 tablets (40 mg total) by mouth daily.     gabapentin 300 MG capsule  Commonly known as:  NEURONTIN  Take 1 capsule (300 mg total) by mouth 2 (two) times daily.     Hydrocodone-Acetaminophen 7.5-300  MG Tabs  Take 1 tablet by mouth 3 (three) times daily as needed.     lidocaine 5 % ointment  Commonly known as:  XYLOCAINE  Apply 1 application topically 2 (two) times daily as needed. Reported on 03/24/2015     LORazepam 0.5 MG tablet  Commonly known as:  ATIVAN  Take 1 tablet (0.5 mg total) by mouth every 4 (four) hours as needed.     potassium chloride 10 MEQ tablet  Commonly known as:  K-DUR  Take 1 tablet (10 mEq total) by mouth daily.     sulfamethoxazole-trimethoprim 800-160 MG tablet  Commonly known as:  BACTRIM DS,SEPTRA DS  Take 1 tablet by mouth 2 (two) times daily.     tamsulosin 0.4 MG Caps capsule  Commonly known as:  FLOMAX  Take 1 capsule (0.4 mg total) by mouth daily.     tiotropium 18 MCG inhalation capsule  Commonly known as:  SPIRIVA  Place 1 capsule (18 mcg total) into inhaler and inhale daily.           Objective:   Physical Exam BP 140/62 mmHg  Pulse 67  Temp(Src) 97.6 F (36.4 C) (Oral)  Ht 5\' 1"  (1.549 m)  Wt 229 lb 12.8 oz (104.237 kg)  BMI 43.44 kg/m2  SpO2 98% General:   Well developed, well nourished . NAD.    HEENT:  Normocephalic . Face symmetric, atraumatic Lungs:  CTA B Normal respiratory effort, no intercostal retractions, no accessory muscle use. Heart: RRR,  no murmur.  +/+++ pretibial edema bilaterally  Abdomen:  Not distended, soft, non-tender. No rebound or rigidity. No CVA tenderness DRE: Sphincter tone normal, prostate  moderately large, seems a slightly tender. Skin: Not pale. Not jaundice Neurologic:  alert & oriented X3.  Speech normal, gait appropriate for age and unassisted Psych--  Cognition and judgment appear intact.  Cooperative with normal attention span and concentration.  Behavior appropriate. No anxious or depressed appearing.    Assessment & Plan:   Assessment>  Prediabetes  HTN EDEMA, LE, chronic (echo 09-2013 ok, [place] 03-2014 no DVT) Hyperlipidemia Anxiety, insomnia --prn doxepin,ativan B12 deficiency MSK --Gait d/o  --Chronic back pain--- hydrocodone (UDS) Rash (persistent throughout 2015--scabies)  Pulmonary Nodule, last CT  09-2014 recheck 1 year  H/o lithiasis  PLAN Prediabetes: Check A1c HTN: Seems well-controlled Edema: Seems at baseline. Back pain: Symptoms chronic and according to the patient  stable. I again think that symptoms could be from spinal stenosis. He may benefit from further eval. Did not go to see orthopedic surgery as recommended. Will insist, re send referral. LUTS: Acute  urinary symptoms, UTI? Prostatitis?.  Will get a PSA, UA, urine culture. Start Bactrim empirically. RTC one month

## 2015-03-24 NOTE — Patient Instructions (Signed)
BEFORE YOU LEAVE THE OFFICE:  GO TO THE LAB : Get the blood work    GO TO THE FRONT DESK Schedule a routine office visit or check up to be done in  1 month Front desk:  [place]: Take the antibiotic Bactrim as recommended Call anytime if you have fever, chills, blood in the urine. Call if not improving in the next 2 or 3 days

## 2015-03-25 ENCOUNTER — Other Ambulatory Visit: Payer: Self-pay | Admitting: General Practice

## 2015-03-27 LAB — URINE CULTURE

## 2015-04-08 ENCOUNTER — Other Ambulatory Visit: Payer: Self-pay | Admitting: Internal Medicine

## 2015-04-09 NOTE — Telephone Encounter (Signed)
Rx printed, awaiting MD signature.  

## 2015-04-09 NOTE — Telephone Encounter (Signed)
[facility]  

## 2015-04-09 NOTE — Telephone Encounter (Signed)
Pt is requesting refill on Lorazepam.  Last OV: 03/24/2015 Last Fill: 01/22/2015 #60 and 1RF UDS: 07/23/2014 Low risk  Please advise.

## 2015-04-09 NOTE — Telephone Encounter (Signed)
Okay #60 and 2 refills 

## 2015-04-10 ENCOUNTER — Ambulatory Visit (INDEPENDENT_AMBULATORY_CARE_PROVIDER_SITE_OTHER): Payer: PPO | Admitting: Internal Medicine

## 2015-04-10 ENCOUNTER — Encounter: Payer: Self-pay | Admitting: Internal Medicine

## 2015-04-10 VITALS — BP 132/76 | HR 66 | Temp 98.2°F | Ht 61.0 in | Wt 234.0 lb

## 2015-04-10 DIAGNOSIS — N390 Urinary tract infection, site not specified: Secondary | ICD-10-CM

## 2015-04-10 DIAGNOSIS — M545 Low back pain: Secondary | ICD-10-CM | POA: Diagnosis not present

## 2015-04-10 DIAGNOSIS — I10 Essential (primary) hypertension: Secondary | ICD-10-CM

## 2015-04-10 DIAGNOSIS — Z09 Encounter for follow-up examination after completed treatment for conditions other than malignant neoplasm: Secondary | ICD-10-CM

## 2015-04-10 LAB — URINALYSIS, ROUTINE W REFLEX MICROSCOPIC
BILIRUBIN URINE: NEGATIVE
HGB URINE DIPSTICK: NEGATIVE
KETONES UR: NEGATIVE
LEUKOCYTES UA: NEGATIVE
NITRITE: NEGATIVE
RBC / HPF: NONE SEEN (ref 0–?)
Specific Gravity, Urine: 1.01 (ref 1.000–1.030)
Total Protein, Urine: NEGATIVE
URINE GLUCOSE: NEGATIVE
UROBILINOGEN UA: 0.2 (ref 0.0–1.0)
WBC UA: NONE SEEN (ref 0–?)
pH: 6.5 (ref 5.0–8.0)

## 2015-04-10 MED ORDER — HYDROCODONE-ACETAMINOPHEN 7.5-300 MG PO TABS: 1.0000 | ORAL_TABLET | Freq: Three times a day (TID) | ORAL | Status: DC | PRN

## 2015-04-10 NOTE — Patient Instructions (Addendum)
GO TO THE LAB : provide a urine sample   GO TO THE FRONT DESK  Schedule a complete physical exam to be done in 3 months  Please be fasting

## 2015-04-10 NOTE — Progress Notes (Signed)
Pre visit review using our clinic review tool, if applicable. No additional management support is needed unless otherwise documented below in the visit note. 

## 2015-04-10 NOTE — Assessment & Plan Note (Signed)
HTN, edema: Seems well-controlled on Lasix, last BMP satisfactory Back pain: To see orthopedic surgery next month. UTI: Documented UTI by urine culture 1-30-217, S/P Bactrim, LUTS decreased but not back to baseline. Recommend UA, urine culture, continue Flomax. Reassess in 3 months. Declined urology referral COPD: Clinical diagnosis, on spiriva as needed, actually no DOE, activities limited by MSK issues. rx observation Pain management: Medications RF RTC 3 months, CPX, fasting

## 2015-04-10 NOTE — Progress Notes (Signed)
Subjective:    Patient ID: Wayne Nash, male    DOB: 1934/02/13, 82 y.o.   MRN: [id number]  DOS:  04/10/2015 Type of visit - description : Follow-up from previous visit Interval history: Diagnosed with a UTI, s/p antibiotics, urinary frequency and urgency better but not back to baseline.   Review of Systems Cont with knee pain, back pain, to see ortho  next month. Denies difficulty breathing No fever chills No gross hematuria  Past Medical History  Diagnosis Date  . Hyperlipidemia   . Hypertension     Past Surgical History  Procedure Laterality Date  . Colonoscopy  2012     negative  . Appendectomy    . Lumbar disc surgery      Dr Shellia Carwin  . Skin biopsy      Social History   Social History  . Marital Status: Divorced    Spouse Name: N/A  . Number of Children: 3  . Years of Education: N/A   Occupational History  . 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 Smoking status: Former Smoker    Quit date: 02/22/1994  . Smokeless tobacco: Not on file     Comment: smoked 1953-1996, up to 1.5 ppd  . Alcohol Use: No     Comment:  quit 1985  . Drug Use: No  . Sexual Activity: Not on file   Other Topics Concern  . Not on file   Social History Narrative   7 Gc, 3 GGchildren   lives by himself -- independent         Medication List       This list is accurate as of: 04/10/15  4:51 PM.  Always use your most recent med list.               aspirin 81 MG tablet  Take 81 mg by mouth daily.     atorvastatin 20 MG tablet  Commonly known as:  LIPITOR  Take 1 tablet (20 mg total) by mouth daily.     carvedilol 6.25 MG tablet  Commonly known as:  COREG  TAKE 1 TABLET BY MOUTH EVERY DAY     cyclobenzaprine 5 MG tablet  Commonly known as:  FLEXERIL  Take 1 tablet (5 mg total) by mouth at bedtime as needed for muscle spasms.     doxepin 10 MG capsule  Commonly known as:  SINEQUAN  Take 1 capsule (10 mg total)  by mouth at bedtime as needed.     furosemide 20 MG tablet  Commonly known as:  LASIX  Take 2 tablets (40 mg total) by mouth daily.     gabapentin 300 MG capsule  Commonly known as:  NEURONTIN  Take 1 capsule (300 mg total) by mouth 2 (two) times daily.     Hydrocodone-Acetaminophen 7.5-300 MG Tabs  Take 1 tablet by mouth 3 (three) times daily as needed.     Hydrocodone-Acetaminophen 7.5-300 MG Tabs  Take 1 tablet by mouth 3 (three) times daily as needed.     LORazepam 0.5 MG tablet  Commonly known as:  ATIVAN  Take 1 tablet (0.5 mg total) by mouth every 4 (four) hours as needed.     potassium chloride 10 MEQ tablet  Commonly known as:  K-DUR  Take 1 tablet (10 mEq total) by mouth daily.     tamsulosin 0.4 MG Caps capsule  Commonly known as:  FLOMAX  Take  1 capsule (0.4 mg total) by mouth daily.     tiotropium 18 MCG inhalation capsule  Commonly known as:  SPIRIVA  Place 1 capsule (18 mcg total) into inhaler and inhale daily.           Objective:   Physical Exam BP 132/76 mmHg  Pulse 66  Temp(Src) 98.2 F (36.8 C) (Oral)  Ht 5\' 1"  (1.549 m)  Wt 234 lb (106.142 kg)  BMI 44.24 kg/m2  SpO2 94% General:   Well developed, well nourished . NAD.  HEENT:  Normocephalic . Face symmetric, atraumatic Lungs:  Decreased breath sounds Normal respiratory effort, no intercostal retractions, no accessory muscle use. Heart: RRR,  no murmur.  No pretibial edema bilaterally  Skin: Not pale. Not jaundice Neurologic:  alert & oriented X3.  Speech normal, gait assisted by a cane, limited by DJD.  Psych--  Cognition and judgment appear intact.  Cooperative with normal attention span and concentration.  Behavior appropriate. No anxious or depressed appearing.      Assessment & Plan:   Assessment>  Prediabetes  HTN EDEMA, LE, chronic (echo 09-2013 ok, [place] 03-2014 no DVT) Hyperlipidemia Anxiety, insomnia --prn doxepin,ativan B12 deficiency COPD (dx 2016: former smoker,  DOE, PFTs rx but not done as off 03-2015) MSK --Gait d/o likely multifactorial --Chronic back pain--- hydrocodone (UDS), started gabapentin 12-2014 --DJD Rash (persistent throughout 2015--scabies) Pulmonary Nodule, last CT  09-2014 recheck 1 year  H/o colelithiasis  PLAN HTN, edema: Seems well-controlled on Lasix, last BMP satisfactory Back pain: To see orthopedic surgery next month. UTI: Documented UTI by urine culture 1-30-217, S/P Bactrim, LUTS decreased but not back to baseline. Recommend UA, urine culture, continue Flomax. Reassess in 3 months. Declined urology referral COPD: Clinical diagnosis, on spiriva as needed, actually no DOE, activities limited by MSK issues. rx observation Pain management: Medications RF RTC 3 months, CPX, fasting

## 2015-04-11 LAB — URINE CULTURE
COLONY COUNT: NO GROWTH
Organism ID, Bacteria: NO GROWTH

## 2015-04-22 ENCOUNTER — Ambulatory Visit: Payer: PPO | Admitting: Internal Medicine

## 2015-04-28 DIAGNOSIS — M25561 Pain in right knee: Secondary | ICD-10-CM | POA: Diagnosis not present

## 2015-04-28 DIAGNOSIS — M25461 Effusion, right knee: Secondary | ICD-10-CM | POA: Diagnosis not present

## 2015-04-28 DIAGNOSIS — M170 Bilateral primary osteoarthritis of knee: Secondary | ICD-10-CM | POA: Diagnosis not present

## 2015-04-28 DIAGNOSIS — M1712 Unilateral primary osteoarthritis, left knee: Secondary | ICD-10-CM | POA: Diagnosis not present

## 2015-04-28 DIAGNOSIS — M25562 Pain in left knee: Secondary | ICD-10-CM | POA: Diagnosis not present

## 2015-05-26 DIAGNOSIS — M25562 Pain in left knee: Secondary | ICD-10-CM | POA: Diagnosis not present

## 2015-05-26 DIAGNOSIS — M170 Bilateral primary osteoarthritis of knee: Secondary | ICD-10-CM | POA: Diagnosis not present

## 2015-05-26 DIAGNOSIS — M25561 Pain in right knee: Secondary | ICD-10-CM | POA: Diagnosis not present

## 2015-05-26 DIAGNOSIS — M1711 Unilateral primary osteoarthritis, right knee: Secondary | ICD-10-CM | POA: Diagnosis not present

## 2015-05-27 ENCOUNTER — Telehealth: Payer: Self-pay | Admitting: Internal Medicine

## 2015-05-27 DIAGNOSIS — M25461 Effusion, right knee: Secondary | ICD-10-CM

## 2015-05-27 NOTE — Telephone Encounter (Signed)
Needs referral to [facility] Dr Verda Cumins, for swelling in right knee.

## 2015-05-27 NOTE — Telephone Encounter (Signed)
Wayne Nash, Please arrange

## 2015-05-27 NOTE — Telephone Encounter (Signed)
Referral placed.

## 2015-06-04 ENCOUNTER — Other Ambulatory Visit: Payer: Self-pay | Admitting: Internal Medicine

## 2015-06-05 ENCOUNTER — Other Ambulatory Visit: Payer: Self-pay | Admitting: Internal Medicine

## 2015-06-11 ENCOUNTER — Other Ambulatory Visit: Payer: Self-pay | Admitting: Internal Medicine

## 2015-06-11 NOTE — Telephone Encounter (Signed)
Is a little too early but okay #60, no refills

## 2015-06-11 NOTE — Telephone Encounter (Signed)
Pt is requesting refill on Lorazepam.  Last OV: 04/10/2015  Last Fill: 04/09/2015 #60 and 2RF Pt sig: 1 tablet q4h PRN UDS: 07/23/2014 Low risk  Please advise.

## 2015-06-12 NOTE — Telephone Encounter (Signed)
[facility]  

## 2015-06-12 NOTE — Telephone Encounter (Signed)
Rx printed, awaiting MD signature.  

## 2015-06-16 ENCOUNTER — Other Ambulatory Visit: Payer: Self-pay | Admitting: Internal Medicine

## 2015-06-16 NOTE — Telephone Encounter (Signed)
Medication filled to pharmacy as requested.   

## 2015-06-17 ENCOUNTER — Telehealth: Payer: Self-pay | Admitting: Internal Medicine

## 2015-06-17 NOTE — Telephone Encounter (Signed)
[place]    She called in to check the status of a Rx sent over to PCP. She would like to know if it was received. Lidocanie vs Prilocoine 2.5% cream.    She says that she will fu back up in 2 days.

## 2015-06-17 NOTE — Telephone Encounter (Signed)
Not received as of yet/SLS 04/25

## 2015-06-24 NOTE — Telephone Encounter (Signed)
[facility] called to check status of this rx listed below best number is [phone] Wayne Nash is reps name

## 2015-06-24 NOTE — Telephone Encounter (Signed)
Not received  

## 2015-07-08 ENCOUNTER — Telehealth: Payer: Self-pay | Admitting: Internal Medicine

## 2015-07-08 MED ORDER — HYDROCODONE-ACETAMINOPHEN 7.5-300 MG PO TABS: 1.0000 | ORAL_TABLET | Freq: Three times a day (TID) | ORAL | Status: DC | PRN

## 2015-07-08 NOTE — Telephone Encounter (Signed)
Rx's printed for May and June 2017, awaiting MD signature.

## 2015-07-08 NOTE — Telephone Encounter (Signed)
Spoke w/ Pt, informed him that Rx's have been placed at the front desk for pick up. Pt verbalized understanding.

## 2015-07-08 NOTE — Telephone Encounter (Signed)
Pt is requesting refill on Hydrocodone.  Last OV: 04/10/2015 Last Fill: 04/10/2015 #90 and 0RF For Feb and March 2017 UDS: 06/25/2014 Low risk  Please advise.

## 2015-07-08 NOTE — Telephone Encounter (Signed)
Refill for Hydrocodone Rx.   CB: [phone]

## 2015-07-08 NOTE — Telephone Encounter (Signed)
Okay #90, 2 prescriptions 

## 2015-07-14 DIAGNOSIS — M170 Bilateral primary osteoarthritis of knee: Secondary | ICD-10-CM | POA: Diagnosis not present

## 2015-07-15 ENCOUNTER — Telehealth: Payer: Self-pay | Admitting: Internal Medicine

## 2015-07-15 NOTE — Telephone Encounter (Signed)
Please get me a copy of the office visit note from orthopedics

## 2015-07-15 NOTE — Telephone Encounter (Signed)
Will await records from Ortho.

## 2015-07-15 NOTE — Telephone Encounter (Signed)
Relation to [id number] Call back number:[phone]   Reason for call:  Patient was seen by orthopedic and states they informed him that he has arthritis in he's knees and would like PCP input.

## 2015-07-22 ENCOUNTER — Other Ambulatory Visit: Payer: Self-pay | Admitting: Internal Medicine

## 2015-07-22 NOTE — Telephone Encounter (Signed)
Pt is requesting refill on Lorazepam 0.5mg . Dr. Ethel Rana Pt.   Last OV: 04/10/2015 Last Fill: 06/12/2015 #60 and 0RF Pt sig: 1 tablet q4h PRN UDS: 07/23/2014 Low risk  Please advise.

## 2015-07-22 NOTE — Telephone Encounter (Signed)
[facility]  

## 2015-07-22 NOTE — Telephone Encounter (Signed)
Lorazepam just faxed to [facility]. Have been awaiting med records from [facility].

## 2015-07-22 NOTE — Telephone Encounter (Signed)
Can be reached: [phone]  Reason for call: pt called in stating that he is concerned about arthritis in knees. He said that he is waiting for feedback from Dr. Larose Kells on which way to go. I'm not sure if records have been received from [facility]. Pt said that his legs hurt. Please call back.

## 2015-07-22 NOTE — Telephone Encounter (Signed)
Called Wayne Nash with Wayne Nash ph# [phone] Requested copy of OV notes from 07/14/15 with Dr. Drema Dallas Was advised pt to f/u with Dr. Drema Dallas in 2 months OV notes being faxed to [phone]  Notified pt that we sent in lorezapam and OV notes are coming to [place] from Ortho.

## 2015-07-22 NOTE — Telephone Encounter (Signed)
Received OV notes from 07/14/2015 from [facility]. Will forward OV note to Dr. Larose Kells for review once he returns to office tomorrow.

## 2015-07-22 NOTE — Telephone Encounter (Signed)
Ph[facility]acy: [id number] - [place], [place]  Reason for call: Pt also requesting refill on lorezapam. Has 6 left. Again request call about his legs.

## 2015-07-22 NOTE — Telephone Encounter (Signed)
Received fax confirmation on 07/22/2015 1341.

## 2015-07-22 NOTE — Telephone Encounter (Signed)
Fax sent to [facility] records at [phone] requesting last several OV notes. Informed last OV we received was from 04/28/2015. Requested they fax to [place] urgently at [phone] or [phone].

## 2015-07-22 NOTE — Telephone Encounter (Signed)
Rx printed, awaiting MD signature. Next UDS at [place] visit.

## 2015-07-22 NOTE — Telephone Encounter (Signed)
OK to refill Lorazepam same strength, same sig, #60, may need UDS?

## 2015-07-23 NOTE — Telephone Encounter (Signed)
Spoke w/ Pt, informed him of Dr. Larose Kells recommendations. Pt stated he is reluctant to have surgery at his age, and is debating having injections. He was worried about Dr. Larose Kells stopping his Hydrocodone refills if he begins injections, informed him that Dr. Larose Kells would normally discuss with Pt before ending medications. Pt verbalized understanding and thanked me for the call.

## 2015-07-23 NOTE — Telephone Encounter (Signed)
Advise patient, I review the orthopedic note, he has end-stage DJD. Options are surgery, a trial with local injections with steroids for temporary relief, also Tylenol as needed.

## 2015-07-24 ENCOUNTER — Other Ambulatory Visit: Payer: Self-pay | Admitting: Medical

## 2015-08-08 ENCOUNTER — Telehealth: Payer: Self-pay

## 2015-08-08 MED ORDER — HYDROCODONE-ACETAMINOPHEN 7.5-300 MG PO TABS: 1.0000 | ORAL_TABLET | Freq: Three times a day (TID) | ORAL | Status: DC | PRN

## 2015-08-08 NOTE — Telephone Encounter (Signed)
Thandie.Latina w/ [facility] calling requesting authorization for 40 tablets for Pt's Hydrocodone (Rx written for 90). Was informed that they only had 50 tablets to give Pt and Pt agreed, however, she would need another prescription written for 40 tablets to total 90 tablets. Informed her she would have to let Pt know he will have to come back to office to pick up another Hydrocodone prescription. Rx printed, awaiting MD signature.

## 2015-08-08 NOTE — Telephone Encounter (Signed)
Rx placed at front desk for pick up.  

## 2015-08-11 ENCOUNTER — Telehealth: Payer: Self-pay | Admitting: Internal Medicine

## 2015-08-11 NOTE — Telephone Encounter (Signed)
Can be reached: [phone]  Reason for call:  Pt said he went to ortho 2 weeks ago and they drained fluid from his knee. He states he is in pain and knee is swollen 2x size of other leg now. He isn't scheduled to go back to them for 2 more weeks. Pt wanted to notify Dr. Larose Kells but he is going to call the Ortho doctor today.

## 2015-08-11 NOTE — Telephone Encounter (Signed)
Agree, needs to d/w ortho , thx

## 2015-08-11 NOTE — Telephone Encounter (Signed)
Just an FYI

## 2015-08-15 DIAGNOSIS — H43392 Other vitreous opacities, left eye: Secondary | ICD-10-CM | POA: Diagnosis not present

## 2015-08-20 ENCOUNTER — Other Ambulatory Visit: Payer: Self-pay | Admitting: Family Medicine

## 2015-08-20 NOTE — Telephone Encounter (Signed)
I will fill if attending not in office

## 2015-08-21 NOTE — Telephone Encounter (Signed)
Rx printed, awaiting MD signature.  

## 2015-08-21 NOTE — Telephone Encounter (Signed)
Pt is requesting refill on Lorazepam. Previously filled by Dr. Charlett Blake in PCP absence.  Last OV: 04/10/2015 Last Fill: 07/22/2015 #60 and 0RF  Pt sig: 1 tablet q4h PRN  UDS: 07/23/2014 Low risk  Please advise.

## 2015-08-21 NOTE — Telephone Encounter (Signed)
[facility]  

## 2015-08-21 NOTE — Telephone Encounter (Signed)
Ok 60 and 2  

## 2015-09-01 ENCOUNTER — Telehealth: Payer: Self-pay | Admitting: Internal Medicine

## 2015-09-01 ENCOUNTER — Encounter: Payer: Self-pay | Admitting: Internal Medicine

## 2015-09-01 DIAGNOSIS — Z79899 Other long term (current) drug therapy: Secondary | ICD-10-CM | POA: Diagnosis not present

## 2015-09-01 DIAGNOSIS — Z79891 Long term (current) use of opiate analgesic: Secondary | ICD-10-CM | POA: Diagnosis not present

## 2015-09-01 MED ORDER — HYDROCODONE-ACETAMINOPHEN 7.5-300 MG PO TABS: 1.0000 | ORAL_TABLET | Freq: Three times a day (TID) | ORAL | Status: DC | PRN

## 2015-09-01 NOTE — Telephone Encounter (Signed)
°  Relationship to patient: Self Can be [phone]     Reason for call: Request refill on Hydrocodone-Acetaminophen 7.5-300 MG TABS [id number]

## 2015-09-01 NOTE — Telephone Encounter (Signed)
Spoke w/ Pt, informed him that Rx has been placed at front desk for pick up at his convenience. Pt verbalized understanding.  

## 2015-09-01 NOTE — Telephone Encounter (Signed)
Pt is requesting refill on Hydrocodone.  Last OV: 04/10/2015  Last Fill: 07/08/2015 #90 and 0RF For May and June 2017 UDS: 07/23/2014 Low risk  Please advise.

## 2015-09-01 NOTE — Telephone Encounter (Signed)
Ok  #90, 2 prescriptions 

## 2015-09-01 NOTE — Telephone Encounter (Signed)
Rx for July and August 2017 printed, awaiting MD signature.

## 2015-09-18 ENCOUNTER — Ambulatory Visit (INDEPENDENT_AMBULATORY_CARE_PROVIDER_SITE_OTHER): Payer: Commercial Managed Care - HMO | Admitting: Internal Medicine

## 2015-09-18 ENCOUNTER — Telehealth: Payer: Self-pay | Admitting: Internal Medicine

## 2015-09-18 ENCOUNTER — Encounter: Payer: Self-pay | Admitting: Internal Medicine

## 2015-09-18 VITALS — BP 126/74 | HR 68 | Temp 98.0°F | Ht 61.0 in | Wt 238.4 lb

## 2015-09-18 DIAGNOSIS — F419 Anxiety disorder, unspecified: Secondary | ICD-10-CM

## 2015-09-18 DIAGNOSIS — F418 Other specified anxiety disorders: Secondary | ICD-10-CM | POA: Diagnosis not present

## 2015-09-18 DIAGNOSIS — I10 Essential (primary) hypertension: Secondary | ICD-10-CM | POA: Diagnosis not present

## 2015-09-18 DIAGNOSIS — F32A Depression, unspecified: Secondary | ICD-10-CM

## 2015-09-18 DIAGNOSIS — J449 Chronic obstructive pulmonary disease, unspecified: Secondary | ICD-10-CM | POA: Diagnosis not present

## 2015-09-18 DIAGNOSIS — E785 Hyperlipidemia, unspecified: Secondary | ICD-10-CM | POA: Diagnosis not present

## 2015-09-18 DIAGNOSIS — R269 Unspecified abnormalities of gait and mobility: Secondary | ICD-10-CM

## 2015-09-18 DIAGNOSIS — F329 Major depressive disorder, single episode, unspecified: Secondary | ICD-10-CM

## 2015-09-18 DIAGNOSIS — Z0000 Encounter for general adult medical examination without abnormal findings: Secondary | ICD-10-CM

## 2015-09-18 LAB — CBC WITH DIFFERENTIAL/PLATELET
BASOS ABS: 0 10*3/uL (ref 0.0–0.1)
BASOS PCT: 0.5 % (ref 0.0–3.0)
EOS ABS: 0.1 10*3/uL (ref 0.0–0.7)
Eosinophils Relative: 1.7 % (ref 0.0–5.0)
HEMATOCRIT: 41.8 % (ref 39.0–52.0)
HEMOGLOBIN: 13.4 g/dL (ref 13.0–17.0)
LYMPHS PCT: 14 % (ref 12.0–46.0)
Lymphs Abs: 1 10*3/uL (ref 0.7–4.0)
MCHC: 32.2 g/dL (ref 30.0–36.0)
MCV: 81 fl (ref 78.0–100.0)
MONOS PCT: 9.8 % (ref 3.0–12.0)
Monocytes Absolute: 0.7 10*3/uL (ref 0.1–1.0)
Neutro Abs: 5.1 10*3/uL (ref 1.4–7.7)
Neutrophils Relative %: 74 % (ref 43.0–77.0)
Platelets: 168 10*3/uL (ref 150.0–400.0)
RBC: 5.15 Mil/uL (ref 4.22–5.81)
RDW: 15.1 % (ref 11.5–15.5)
WBC: 6.9 10*3/uL (ref 4.0–10.5)

## 2015-09-18 LAB — BASIC METABOLIC PANEL
BUN: 14 mg/dL (ref 6–23)
CALCIUM: 9.5 mg/dL (ref 8.4–10.5)
CO2: 33 mEq/L — ABNORMAL HIGH (ref 19–32)
Chloride: 103 mEq/L (ref 96–112)
Creatinine, Ser: 0.51 mg/dL (ref 0.40–1.50)
GFR: 166.17 mL/min (ref >60.00)
GLUCOSE: 100 mg/dL — AB (ref 70–99)
Potassium: 3.9 mEq/L (ref 3.5–5.1)
SODIUM: 140 meq/L (ref 135–145)

## 2015-09-18 LAB — LIPID PANEL
Cholesterol: 141 mg/dL (ref 0–200)
HDL: 52.5 mg/dL
LDL Cholesterol: 73 mg/dL (ref 0–99)
NonHDL: 88.76
Total CHOL/HDL Ratio: 3
Triglycerides: 78 mg/dL (ref 0.0–149.0)
VLDL: 15.6 mg/dL (ref 0.0–40.0)

## 2015-09-18 LAB — ALT: ALT: 8 U/L (ref 0–53)

## 2015-09-18 LAB — AST: AST: 10 U/L (ref 0–37)

## 2015-09-18 MED ORDER — ESCITALOPRAM OXALATE 10 MG PO TABS: 10.0000 mg | ORAL_TABLET | Freq: Every day | ORAL | 1 refills | Status: DC

## 2015-09-18 NOTE — Assessment & Plan Note (Signed)
Prediabetes: A1c is stable over time, recheck on RTC HTN: Continue carvedilol, Lasix, potassium, check a BMP and CBC Edema: Stable Hyperlipidemia: Continue Lipitor, check a FLP B12 deficiency: Recommend oral supplements Anxiety insomnia: Now also with depression, not suicidal, counseled, continue doxepin as needed for sleep, start Lexapro. Also recommended to see a counselor but not sure if he would be able to come back to the office weekly. ADLs: Starting to have difficulty with ADLs, will try to get him some help. RTC 6 weeks.

## 2015-09-18 NOTE — Progress Notes (Signed)
Subjective:    Patient ID: Wayne Nash, male    DOB: 1933-06-22, 82 y.o.   MRN: [id number]  DOS:  09/18/2015 Type of visit - description :  Interval history: Here for Medicare AWV:   1. Risk factors based on Past M, S, F history: reviewed 2. Physical Activities:  Mobility limited, uses 2 canes, has walker  3. Depression/mood: +screening, feels lonely,  frustrated by not being able to do much, denies suicidal ideas 4. Hearing:  No problems noted or reported  5. ADL's: drives a little, ADL independent but   needs help; lives byhimself 6. Fall Risk: increased risk,  prevention discussed , see AVS 7. home Safety: does feel safe at home  8. Height, weight, & visual acuity: see VS, sees eye doctor regulalrly 9. Counseling: provided 10. Labs ordered based on risk factors: if needed  11. Referral Coordination: if needed 12. Care Plan, see assessment and plan , written personalized plan provided , see AVS 13. Cognitive Assessment: motor skills slt decreased for age,  cognition appropriate for age 42. Care team updated   15. End-of-life care discussed, has a HC POA   In addition, today we discussed the following: HTN: Good med compliance, not ambulatory BPs, BP today is very good High cholesterol: Good med compliance, due for labs COPD: Essentially no symptoms, on Spiriva as needed MSK: Pain relatively well controlled, not taking gabapentin consistently Insomnia: Hardly ever takes doxepin   Review of Systems Constitutional: No fever. No chills. No unexplained wt changes. No unusual sweats  HEENT: No dental problems, no ear discharge, no facial swelling, no voice changes. No eye discharge, no eye  redness , no  intolerance to light   Respiratory: No wheezing , no  difficulty breathing. No cough , no mucus production  Cardiovascular: No CP, + leg swelling at baseline , no  Palpitations  GI: no nausea, no vomiting, no diarrhea , no  abdominal pain.  No blood in the stools. No  dysphagia, no odynophagia Occasional constipation    Endocrine: No polyphagia, no polyuria , no polydipsia  GU: No dysuria, gross hematuria, difficulty urinating. No urinary urgency, no frequency.  Musculoskeletal: No joint swellings or unusual aches or pains  Skin: No change in the color of the skin, palor , no  Rash  Allergic, immunologic: No environmental allergies , no  food allergies  Neurological: No dizziness no  syncope. No headaches. No diplopia, no slurred, no slurred speech, no motor deficits, no facial  Numbness  Hematological: No enlarged lymph nodes, no easy bruising , no unusual bleedings  Psychiatry: No suicidal ideas, no hallucinations, no beavior problems, no confusion.     Past Medical History:  Diagnosis Date  . Hyperlipidemia   . Hypertension   . Primary osteoarthritis of left knee   . Primary osteoarthritis of right knee     Past Surgical History:  Procedure Laterality Date  . APPENDECTOMY    . LUMBAR DISC SURGERY     Dr Shellia Carwin  . SKIN BIOPSY      Social History   Social History  . Marital status: Divorced    Spouse name: N/A  . Number of children: 3  . Years of education: N/A   Occupational History  . 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 Smoking status: Former Smoker    Quit date: 02/22/1994  . Smokeless tobacco: Never Used     Comment: smoked  [id number], up to 1.5 ppd  . Alcohol use No     Comment:  quit 1985  . Drug use: No  . Sexual activity: Not on file   Other Topics Concern  . Not on file   Social History Narrative   3 children, all live within 47 miles   7 Gc, 3 GGchildren   lives by himself       Family History  Problem Relation Age of Onset  . Cancer Mother     pancreatic  . Diabetes Neg Hx   . Heart disease Neg Hx   . Stroke Neg Hx   . Colon cancer Neg Hx   . Prostate cancer Neg Hx        Medication List       Accurate as of 09/18/15  1:53 PM.  Always use your most recent med list.          aspirin 81 MG tablet Take 81 mg by mouth daily.   atorvastatin 20 MG tablet Commonly known as:  LIPITOR TAKE 1 TABLET BY MOUTH EVERY DAY   carvedilol 6.25 MG tablet Commonly known as:  COREG TAKE 1 TABLET BY MOUTH EVERY DAY   cyclobenzaprine 5 MG tablet Commonly known as:  FLEXERIL Take 1 tablet (5 mg total) by mouth at bedtime as needed for muscle spasms.   doxepin 10 MG capsule Commonly known as:  SINEQUAN Take 1 capsule (10 mg total) by mouth at bedtime as needed.   escitalopram 10 MG tablet Commonly known as:  LEXAPRO Take 1 tablet (10 mg total) by mouth daily.   furosemide 20 MG tablet Commonly known as:  LASIX Take 2 tablets (40 mg total) by mouth daily.   gabapentin 300 MG capsule Commonly known as:  NEURONTIN Take 1 capsule (300 mg total) by mouth 2 (two) times daily.   Hydrocodone-Acetaminophen 7.5-300 MG Tabs Take 1 tablet by mouth 3 (three) times daily as needed.   Hydrocodone-Acetaminophen 7.5-300 MG Tabs Take 1 tablet by mouth 3 (three) times daily as needed.   LORazepam 0.5 MG tablet Commonly known as:  ATIVAN Take 1 tablet (0.5 mg total) by mouth every 4 (four) hours as needed.   potassium chloride 10 MEQ tablet Commonly known as:  K-DUR Take 1 tablet (10 mEq total) by mouth daily.   tamsulosin 0.4 MG Caps capsule Commonly known as:  FLOMAX Take 1 capsule (0.4 mg total) by mouth daily.   tiotropium 18 MCG inhalation capsule Commonly known as:  SPIRIVA Place 1 capsule (18 mcg total) into inhaler and inhale daily.          Objective:   Physical Exam BP 126/74 (BP Location: Right Arm, Patient Position: Sitting, Cuff Size: Normal)   Pulse 68   Temp 98 F (36.7 C) (Oral)   Ht 5\' 1"  (1.549 m)   Wt 238 lb 6 oz (108.1 kg)   SpO2 95%   BMI 45.04 kg/m   General:   Well developed, well nourished .  Neck: No  thyromegaly  HEENT:  Normocephalic . Face symmetric, atraumatic Lungs:  Decreased  breath sounds Normal respiratory effort, no intercostal retractions, no accessory muscle use. Heart: RRR,  no murmur.  +/+++ pretibial edema bilaterally  Abdomen:  Not distended, soft, non-tender.  .   Skin: Exposed areas without rash. Not pale. Not jaundice Neurologic:  alert & oriented X3.  Speech normal, gait not tested, sitting in a wheelchair. Strength symmetric and appropriate for age.  Psych: Cognition and judgment  appear intact.  Cooperative with normal attention span and concentration.  Behavior appropriate. Tearful at times during the visit    Assessment & Plan:  Assessment>  Prediabetes  HTN EDEMA, LE, chronic (echo 09-2013 ok, [place] 03-2014 no DVT) Hyperlipidemia Anxiety, insomnia --prn doxepin,ativan B12 deficiency COPD (dx 2016: former smoker, DOE, PFTs rx but not done as off 03-2015) MSK --Gait d/o likely multifactorial --Chronic back pain--- hydrocodone (UDS), started gabapentin 12-2014 --DJD Rash (persistent throughout 2015--scabies) Pulmonary Nodule, last CT  09-2014 recheck 1 year  H/o colelithiasis  PLAN Prediabetes: A1c is stable over time, recheck on RTC HTN: Continue carvedilol, Lasix, potassium, check a BMP and CBC Edema: Stable Hyperlipidemia: Continue Lipitor, check a FLP B12 deficiency: Recommend oral supplements Anxiety insomnia: Now also with depression, not suicidal, counseled, continue doxepin as needed for sleep, start Lexapro. Also recommended to see a counselor but not sure if he would be able to come back to the office weekly. ADLs: Starting to have difficulty with ADLs, will try to get him some help. RTC 6 weeks.

## 2015-09-18 NOTE — Patient Instructions (Addendum)
Get your blood work before you leave   Don't forget to take gabapentin twice a day for pain  Don't forget to take doxepin as needed for difficulty sleeping.   Start Lexapro, this is a very good medication for depression, see a counselor if you can.   Take a OTC vitamin B12 supplement every day   Come back in 6 weeks for a checkup.    Fall Prevention and Home Safety Falls cause injuries and can affect all age groups. It is possible to use preventive measures to significantly decrease the likelihood of falls. There are many simple measures which can make your home safer and prevent falls. OUTDOORS  Repair cracks and edges of walkways and driveways.  Remove high doorway thresholds.  Trim shrubbery on the main path into your home.  Have good outside lighting.  Clear walkways of tools, rocks, debris, and clutter.  Check that handrails are not broken and are securely fastened. Both sides of steps should have handrails.  Have leaves, snow, and ice cleared regularly.  Use sand or salt on walkways during winter months.  In the garage, clean up grease or oil spills. BATHROOM  Install night lights.  Install grab bars by the toilet and in the tub and shower.  Use non-skid mats or decals in the tub or shower.  Place a plastic non-slip stool in the shower to sit on, if needed.  Keep floors dry and clean up all water on the floor immediately.  Remove soap buildup in the tub or shower on a regular basis.  Secure bath mats with non-slip, double-sided rug tape.  Remove throw rugs and tripping hazards from the floors. BEDROOMS  Install night lights.  Make sure a bedside light is easy to reach.  Do not use oversized bedding.  Keep a telephone by your bedside.  Have a firm chair with side arms to use for getting dressed.  Remove throw rugs and tripping hazards from the floor. KITCHEN  Keep handles on pots and pans turned toward the center of the stove. Use back  burners when possible.  Clean up spills quickly and allow time for drying.  Avoid walking on wet floors.  Avoid hot utensils and knives.  Position shelves so they are not too high or low.  Place commonly used objects within easy reach.  If necessary, use a sturdy step stool with a grab bar when reaching.  Keep electrical cables out of the way.  Do not use floor polish or wax that makes floors slippery. If you must use wax, use non-skid floor wax.  Remove throw rugs and tripping hazards from the floor. STAIRWAYS  Never leave objects on stairs.  Place handrails on both sides of stairways and use them. Fix any loose handrails. Make sure handrails on both sides of the stairways are as long as the stairs.  Check carpeting to make sure it is firmly attached along stairs. Make repairs to worn or loose carpet promptly.  Avoid placing throw rugs at the top or bottom of stairways, or properly secure the rug with carpet tape to prevent slippage. Get rid of throw rugs, if possible.  Have an electrician put in a light switch at the top and bottom of the stairs. OTHER FALL PREVENTION TIPS  Wear low-heel or rubber-soled shoes that are supportive and fit well. Wear closed toe shoes.  When using a stepladder, make sure it is fully opened and both spreaders are firmly locked. Do not climb a closed stepladder.  Add color or contrast paint or tape to grab bars and handrails in your home. Place contrasting color strips on first and last steps.  Learn and use mobility aids as needed. Install an electrical emergency response system.  Turn on lights to avoid dark areas. Replace light bulbs that burn out immediately. Get light switches that glow.  Arrange furniture to create clear pathways. Keep furniture in the same place.  Firmly attach carpet with non-skid or double-sided tape.  Eliminate uneven floor surfaces.  Select a carpet pattern that does not visually hide the edge of steps.  Be  aware of all pets. OTHER HOME SAFETY TIPS  Set the water temperature for 120 F (48.8 C).  Keep emergency numbers on or near the telephone.  Keep smoke detectors on every level of the home and near sleeping areas. Document Released: 01/29/2002 Document Revised: 08/10/2011 Document Reviewed: 04/30/2011 [facility] Patient Information 2015 [place], [place]. This information is not intended to replace advice given to you by your health care provider. Make sure you discuss any questions you have with your health care provider.   Preventive Care for Adults Ages 62 and over  Blood pressure check.** / Every 1 to 2 years.  Lipid and cholesterol check.**/ Every 5 years beginning at age 92.  Lung cancer screening. / Every year if you are aged 66-80 years and have a 30-pack-year history of smoking and currently smoke or have quit within the past 15 years. Yearly screening is stopped once you have quit smoking for at least 15 years or develop a health problem that would prevent you from having lung cancer treatment.  Fecal occult blood test (FOBT) of stool. / Every year beginning at age 1 and continuing until age 28. You may not have to do this test if you get a colonoscopy every 10 years.  Flexible sigmoidoscopy** or colonoscopy.** / Every 5 years for a flexible sigmoidoscopy or every 10 years for a colonoscopy beginning at age 79 and continuing until age 31.  Hepatitis C blood test.** / For all people born from [birth year] through 1965 and any individual with known risks for hepatitis C.  Abdominal aortic aneurysm (AAA) screening.** / A one-time screening for ages 5 to 23 years who are current or former smokers.  Skin self-exam. / Monthly.  Influenza vaccine. / Every year.  Tetanus, diphtheria, and acellular pertussis (Tdap/Td) vaccine.** / 1 dose of Td every 10 years.  Varicella vaccine.** / Consult your health care provider.  Zoster vaccine.** / 1 dose for adults aged 38 years or  older.  Pneumococcal 13-valent conjugate (PCV13) vaccine.** / Consult your health care provider.  Pneumococcal polysaccharide (PPSV23) vaccine.** / 1 dose for all adults aged 43 years and older.  Meningococcal vaccine.** / Consult your health care provider.  Hepatitis A vaccine.** / Consult your health care provider.  Hepatitis B vaccine.** / Consult your health care provider.  Haemophilus influenzae type b (Hib) vaccine.** / Consult your health care provider. **Family history and personal history of risk and conditions may change your health care provider's recommendations. Document Released: 04/06/2001 Document Revised: 02/13/2013 Document Reviewed: 07/06/2010 [facility] Patient Information 2015 [place], [place]. This information is not intended to replace advice given to you by your health care provider. Make sure you discuss any questions you have with your health care provider.

## 2015-09-18 NOTE — Telephone Encounter (Signed)
Pt says that the provider discussed helping him get assisted with home health. Pt would like to know if provider is a part of the Meals On Wheels also?    Please f/u with pt to let him know   [phone]

## 2015-09-18 NOTE — Assessment & Plan Note (Addendum)
Td 2012;  pnm 2012;  prevnar 2016;   zostavax 2009  CCS: No further screenings, see previous entry Prostate cancer screening: No further screenings, see previous entry   Diet -- discussed

## 2015-09-18 NOTE — Telephone Encounter (Signed)
Spoke w/ Pt, informed him that he can call [employer] [facility] [place] to discuss getting set up [employer] on [employer]. Their number is [phone]. Pt verbalized understanding.

## 2015-09-18 NOTE — Progress Notes (Signed)
Pre visit review using our clinic review tool, if applicable. No additional management support is needed unless otherwise documented below in the visit note. 

## 2015-09-19 ENCOUNTER — Telehealth: Payer: Self-pay | Admitting: Internal Medicine

## 2015-09-19 NOTE — Telephone Encounter (Signed)
Tried calling Pt, call could not be completed as dialed, will try again on return to office Monday.

## 2015-09-19 NOTE — Telephone Encounter (Signed)
Caller name: Ilyan Relation to pt: self Call back number: [place]:  Reason for call: Pt states was seen yesterday (09-18-15) and was confused if he had his annual physical done, pt was explained he did, but also wanted to know that he had on his med list the rx  gabapentin (NEURONTIN) 300 MG capsule, pt states does not remember taking that med before and wanted to clarify that with PCP. Pt wanted to ask also about the process of having Meals on Wheels and some home help services requested for him by his Provider. Please advise.

## 2015-09-22 ENCOUNTER — Telehealth: Payer: Self-pay | Admitting: Internal Medicine

## 2015-09-22 DIAGNOSIS — F329 Major depressive disorder, single episode, unspecified: Secondary | ICD-10-CM | POA: Diagnosis not present

## 2015-09-22 DIAGNOSIS — J449 Chronic obstructive pulmonary disease, unspecified: Secondary | ICD-10-CM | POA: Diagnosis not present

## 2015-09-22 DIAGNOSIS — I10 Essential (primary) hypertension: Secondary | ICD-10-CM | POA: Diagnosis not present

## 2015-09-22 DIAGNOSIS — D519 Vitamin B12 deficiency anemia, unspecified: Secondary | ICD-10-CM | POA: Diagnosis not present

## 2015-09-22 DIAGNOSIS — M170 Bilateral primary osteoarthritis of knee: Secondary | ICD-10-CM | POA: Diagnosis not present

## 2015-09-22 DIAGNOSIS — R600 Localized edema: Secondary | ICD-10-CM | POA: Diagnosis not present

## 2015-09-22 DIAGNOSIS — R7303 Prediabetes: Secondary | ICD-10-CM | POA: Diagnosis not present

## 2015-09-22 DIAGNOSIS — R296 Repeated falls: Secondary | ICD-10-CM | POA: Diagnosis not present

## 2015-09-22 DIAGNOSIS — F419 Anxiety disorder, unspecified: Secondary | ICD-10-CM | POA: Diagnosis not present

## 2015-09-22 MED ORDER — GABAPENTIN 300 MG PO CAPS: 300.0000 mg | ORAL_CAPSULE | Freq: Two times a day (BID) | ORAL | 5 refills | Status: DC

## 2015-09-22 MED ORDER — POTASSIUM CHLORIDE ER 10 MEQ PO TBCR: 10.0000 meq | EXTENDED_RELEASE_TABLET | Freq: Every day | ORAL | 1 refills | Status: AC

## 2015-09-22 NOTE — Telephone Encounter (Signed)
Caller name: Estill Bamberg with [facility] Can be reached: [phone]  Reason for call: did eval on pt today, would like VO for 2 week 1, 1 week 1 for med mgmt - would also like order for social work and PT - she states they have notes pt is to be on gabapentin and potassium but does not have either in the home - she notes pt is taking 40mg  daily lasix - please advise if pt to be on meds

## 2015-09-22 NOTE — Telephone Encounter (Signed)
Okay orders as requested Send a prescription for gabapentin and potassium supplements and advise patient to take those medications along the other ones.

## 2015-09-22 NOTE — Telephone Encounter (Signed)
See telephone note 09/22/2015 for further details.

## 2015-09-22 NOTE — Telephone Encounter (Signed)
Please advise 

## 2015-09-22 NOTE — Telephone Encounter (Signed)
Spoke w/ Estill Bamberg, verbal orders given for med management, social work, and PT. Informed her that [facility] and Potassium Chloride. Estill Bamberg verbalized understanding and informed me she would let Pt know that Rx's have been sent to pharmacy for pick up.

## 2015-09-23 ENCOUNTER — Telehealth: Payer: Self-pay | Admitting: Internal Medicine

## 2015-09-23 ENCOUNTER — Telehealth: Payer: Self-pay

## 2015-09-23 NOTE — Telephone Encounter (Addendum)
Relation to [id number] Call back [place]:  Reason for call:  Patient requesting a handicap letter stating he needs a handicap assecible apartment. Patient would like to pick up letter. Please advise

## 2015-09-23 NOTE — Telephone Encounter (Signed)
UDS: 09/01/2015  Positive for Hydrocodone Positive for Lorazepam  Low risk per Dr. Larose Kells 09/22/2015

## 2015-09-23 NOTE — Telephone Encounter (Signed)
Please print a letter To whom it may concern Wayne Nash is a 82 year old gentleman patient of mine, he has multiple medical issues that limit severely his mobility. He will benefit from having a handicapped accessible apartment.

## 2015-09-23 NOTE — Telephone Encounter (Signed)
Letter printed, awaiting MD signature.

## 2015-09-23 NOTE — Telephone Encounter (Signed)
Please advise 

## 2015-09-24 NOTE — Telephone Encounter (Signed)
Please inform Pt that letter has been placed at front desk for pick up at his convenience. Thank you.

## 2015-09-24 NOTE — Telephone Encounter (Signed)
Pt called and requested I fax letter to [place]: Wayne Nash Fax: [phone]  He will still come get the original

## 2015-09-24 NOTE — Telephone Encounter (Signed)
Patient informed. 

## 2015-09-26 DIAGNOSIS — D519 Vitamin B12 deficiency anemia, unspecified: Secondary | ICD-10-CM | POA: Diagnosis not present

## 2015-09-26 DIAGNOSIS — R7303 Prediabetes: Secondary | ICD-10-CM | POA: Diagnosis not present

## 2015-09-26 DIAGNOSIS — R600 Localized edema: Secondary | ICD-10-CM | POA: Diagnosis not present

## 2015-09-26 DIAGNOSIS — J449 Chronic obstructive pulmonary disease, unspecified: Secondary | ICD-10-CM | POA: Diagnosis not present

## 2015-09-26 DIAGNOSIS — I10 Essential (primary) hypertension: Secondary | ICD-10-CM | POA: Diagnosis not present

## 2015-09-26 DIAGNOSIS — M170 Bilateral primary osteoarthritis of knee: Secondary | ICD-10-CM | POA: Diagnosis not present

## 2015-09-26 DIAGNOSIS — R296 Repeated falls: Secondary | ICD-10-CM | POA: Diagnosis not present

## 2015-09-26 DIAGNOSIS — F329 Major depressive disorder, single episode, unspecified: Secondary | ICD-10-CM | POA: Diagnosis not present

## 2015-09-26 DIAGNOSIS — F419 Anxiety disorder, unspecified: Secondary | ICD-10-CM | POA: Diagnosis not present

## 2015-09-29 ENCOUNTER — Other Ambulatory Visit: Payer: Self-pay

## 2015-09-29 DIAGNOSIS — M25469 Effusion, unspecified knee: Secondary | ICD-10-CM | POA: Diagnosis not present

## 2015-09-29 DIAGNOSIS — M170 Bilateral primary osteoarthritis of knee: Secondary | ICD-10-CM | POA: Diagnosis not present

## 2015-09-29 DIAGNOSIS — M25561 Pain in right knee: Secondary | ICD-10-CM | POA: Diagnosis not present

## 2015-09-29 DIAGNOSIS — M25562 Pain in left knee: Secondary | ICD-10-CM | POA: Diagnosis not present

## 2015-09-29 DIAGNOSIS — G8929 Other chronic pain: Secondary | ICD-10-CM | POA: Diagnosis not present

## 2015-09-29 MED ORDER — ATORVASTATIN CALCIUM 20 MG PO TABS: 20.0000 mg | ORAL_TABLET | Freq: Every day | ORAL | 1 refills | Status: DC

## 2015-09-29 NOTE — Telephone Encounter (Signed)
Received paperwork to be completed by PCP for housing accommodations for Pt. Forms completed, signed and faxed back to Kyra Manges- Service Coordinator w/ [place] at [phone]. Forms sent for scanning and fax confirmation received at 0746.

## 2015-09-30 ENCOUNTER — Telehealth: Payer: Self-pay | Admitting: Internal Medicine

## 2015-09-30 NOTE — Telephone Encounter (Signed)
Yes

## 2015-09-30 NOTE — Telephone Encounter (Signed)
LMOM for [facility] w/ verbal orders. Instructed to call if questions or concerns.

## 2015-09-30 NOTE — Telephone Encounter (Signed)
Okay to extend PT orders?

## 2015-09-30 NOTE — Telephone Encounter (Signed)
Caller name: Hilliard Clark Relationship to patient: [facility] Can be reached: [phone]   Reason for call: Need verbal order to continue PT 2 times per week for 5 weeks. May leave verbal order on VM

## 2015-10-02 DIAGNOSIS — D519 Vitamin B12 deficiency anemia, unspecified: Secondary | ICD-10-CM | POA: Diagnosis not present

## 2015-10-02 DIAGNOSIS — R7303 Prediabetes: Secondary | ICD-10-CM | POA: Diagnosis not present

## 2015-10-02 DIAGNOSIS — F329 Major depressive disorder, single episode, unspecified: Secondary | ICD-10-CM | POA: Diagnosis not present

## 2015-10-02 DIAGNOSIS — I10 Essential (primary) hypertension: Secondary | ICD-10-CM | POA: Diagnosis not present

## 2015-10-02 DIAGNOSIS — J449 Chronic obstructive pulmonary disease, unspecified: Secondary | ICD-10-CM | POA: Diagnosis not present

## 2015-10-02 DIAGNOSIS — R600 Localized edema: Secondary | ICD-10-CM | POA: Diagnosis not present

## 2015-10-02 DIAGNOSIS — R296 Repeated falls: Secondary | ICD-10-CM | POA: Diagnosis not present

## 2015-10-02 DIAGNOSIS — M170 Bilateral primary osteoarthritis of knee: Secondary | ICD-10-CM | POA: Diagnosis not present

## 2015-10-02 DIAGNOSIS — F419 Anxiety disorder, unspecified: Secondary | ICD-10-CM | POA: Diagnosis not present

## 2015-10-03 DIAGNOSIS — M170 Bilateral primary osteoarthritis of knee: Secondary | ICD-10-CM | POA: Diagnosis not present

## 2015-10-03 DIAGNOSIS — D519 Vitamin B12 deficiency anemia, unspecified: Secondary | ICD-10-CM | POA: Diagnosis not present

## 2015-10-03 DIAGNOSIS — R296 Repeated falls: Secondary | ICD-10-CM | POA: Diagnosis not present

## 2015-10-03 DIAGNOSIS — F419 Anxiety disorder, unspecified: Secondary | ICD-10-CM | POA: Diagnosis not present

## 2015-10-03 DIAGNOSIS — I10 Essential (primary) hypertension: Secondary | ICD-10-CM | POA: Diagnosis not present

## 2015-10-03 DIAGNOSIS — F329 Major depressive disorder, single episode, unspecified: Secondary | ICD-10-CM | POA: Diagnosis not present

## 2015-10-03 DIAGNOSIS — R7303 Prediabetes: Secondary | ICD-10-CM | POA: Diagnosis not present

## 2015-10-03 DIAGNOSIS — R600 Localized edema: Secondary | ICD-10-CM | POA: Diagnosis not present

## 2015-10-03 DIAGNOSIS — J449 Chronic obstructive pulmonary disease, unspecified: Secondary | ICD-10-CM | POA: Diagnosis not present

## 2015-10-06 ENCOUNTER — Telehealth: Payer: Self-pay

## 2015-10-06 NOTE — Telephone Encounter (Signed)
Received Home Health Certification and Plan of Care via fax for [place], [id number] and PT 1WK1, 2WK5.  Form numbered and forwarded to Dr. Larose Kells for review and signature.

## 2015-10-07 DIAGNOSIS — J449 Chronic obstructive pulmonary disease, unspecified: Secondary | ICD-10-CM | POA: Diagnosis not present

## 2015-10-07 DIAGNOSIS — F419 Anxiety disorder, unspecified: Secondary | ICD-10-CM | POA: Diagnosis not present

## 2015-10-07 DIAGNOSIS — R7303 Prediabetes: Secondary | ICD-10-CM | POA: Diagnosis not present

## 2015-10-07 DIAGNOSIS — R296 Repeated falls: Secondary | ICD-10-CM | POA: Diagnosis not present

## 2015-10-07 DIAGNOSIS — F329 Major depressive disorder, single episode, unspecified: Secondary | ICD-10-CM | POA: Diagnosis not present

## 2015-10-07 DIAGNOSIS — M170 Bilateral primary osteoarthritis of knee: Secondary | ICD-10-CM | POA: Diagnosis not present

## 2015-10-07 DIAGNOSIS — D519 Vitamin B12 deficiency anemia, unspecified: Secondary | ICD-10-CM | POA: Diagnosis not present

## 2015-10-07 DIAGNOSIS — I10 Essential (primary) hypertension: Secondary | ICD-10-CM | POA: Diagnosis not present

## 2015-10-07 DIAGNOSIS — R600 Localized edema: Secondary | ICD-10-CM | POA: Diagnosis not present

## 2015-10-07 NOTE — Telephone Encounter (Signed)
Form's reviewed and signed by PCP and faxed to [facility] at [phone]. Forms sent for scanning.

## 2015-10-08 DIAGNOSIS — D519 Vitamin B12 deficiency anemia, unspecified: Secondary | ICD-10-CM | POA: Diagnosis not present

## 2015-10-08 DIAGNOSIS — F329 Major depressive disorder, single episode, unspecified: Secondary | ICD-10-CM | POA: Diagnosis not present

## 2015-10-08 DIAGNOSIS — I10 Essential (primary) hypertension: Secondary | ICD-10-CM | POA: Diagnosis not present

## 2015-10-08 DIAGNOSIS — F419 Anxiety disorder, unspecified: Secondary | ICD-10-CM | POA: Diagnosis not present

## 2015-10-08 DIAGNOSIS — R600 Localized edema: Secondary | ICD-10-CM | POA: Diagnosis not present

## 2015-10-08 DIAGNOSIS — R296 Repeated falls: Secondary | ICD-10-CM | POA: Diagnosis not present

## 2015-10-08 DIAGNOSIS — R7303 Prediabetes: Secondary | ICD-10-CM | POA: Diagnosis not present

## 2015-10-08 DIAGNOSIS — M170 Bilateral primary osteoarthritis of knee: Secondary | ICD-10-CM | POA: Diagnosis not present

## 2015-10-08 DIAGNOSIS — J449 Chronic obstructive pulmonary disease, unspecified: Secondary | ICD-10-CM | POA: Diagnosis not present

## 2015-10-09 DIAGNOSIS — R296 Repeated falls: Secondary | ICD-10-CM | POA: Diagnosis not present

## 2015-10-09 DIAGNOSIS — D519 Vitamin B12 deficiency anemia, unspecified: Secondary | ICD-10-CM | POA: Diagnosis not present

## 2015-10-09 DIAGNOSIS — J449 Chronic obstructive pulmonary disease, unspecified: Secondary | ICD-10-CM | POA: Diagnosis not present

## 2015-10-09 DIAGNOSIS — F329 Major depressive disorder, single episode, unspecified: Secondary | ICD-10-CM | POA: Diagnosis not present

## 2015-10-09 DIAGNOSIS — R600 Localized edema: Secondary | ICD-10-CM | POA: Diagnosis not present

## 2015-10-09 DIAGNOSIS — I10 Essential (primary) hypertension: Secondary | ICD-10-CM | POA: Diagnosis not present

## 2015-10-09 DIAGNOSIS — R7303 Prediabetes: Secondary | ICD-10-CM | POA: Diagnosis not present

## 2015-10-09 DIAGNOSIS — F419 Anxiety disorder, unspecified: Secondary | ICD-10-CM | POA: Diagnosis not present

## 2015-10-09 DIAGNOSIS — M170 Bilateral primary osteoarthritis of knee: Secondary | ICD-10-CM | POA: Diagnosis not present

## 2015-10-13 ENCOUNTER — Telehealth: Payer: Self-pay | Admitting: Internal Medicine

## 2015-10-13 NOTE — Telephone Encounter (Signed)
Agree, please give the order

## 2015-10-13 NOTE — Telephone Encounter (Signed)
LMOM w/ verbal orders. Instructed to call if questions or concerns.

## 2015-10-13 NOTE — Telephone Encounter (Signed)
Caller name: Geni Bers  Relationship to patient: [facility]  Can be reached: [phone]   Reason for call: Request orders for 1 time per week for 3 weeks with 1 PRN to assist with community resources and counseling

## 2015-10-13 NOTE — Telephone Encounter (Signed)
Okay to give orders? 

## 2015-10-15 DIAGNOSIS — I10 Essential (primary) hypertension: Secondary | ICD-10-CM | POA: Diagnosis not present

## 2015-10-15 DIAGNOSIS — R296 Repeated falls: Secondary | ICD-10-CM | POA: Diagnosis not present

## 2015-10-15 DIAGNOSIS — F419 Anxiety disorder, unspecified: Secondary | ICD-10-CM | POA: Diagnosis not present

## 2015-10-15 DIAGNOSIS — F329 Major depressive disorder, single episode, unspecified: Secondary | ICD-10-CM | POA: Diagnosis not present

## 2015-10-15 DIAGNOSIS — D519 Vitamin B12 deficiency anemia, unspecified: Secondary | ICD-10-CM | POA: Diagnosis not present

## 2015-10-15 DIAGNOSIS — J449 Chronic obstructive pulmonary disease, unspecified: Secondary | ICD-10-CM | POA: Diagnosis not present

## 2015-10-15 DIAGNOSIS — R7303 Prediabetes: Secondary | ICD-10-CM | POA: Diagnosis not present

## 2015-10-15 DIAGNOSIS — R600 Localized edema: Secondary | ICD-10-CM | POA: Diagnosis not present

## 2015-10-15 DIAGNOSIS — M170 Bilateral primary osteoarthritis of knee: Secondary | ICD-10-CM | POA: Diagnosis not present

## 2015-10-16 DIAGNOSIS — R600 Localized edema: Secondary | ICD-10-CM | POA: Diagnosis not present

## 2015-10-16 DIAGNOSIS — D519 Vitamin B12 deficiency anemia, unspecified: Secondary | ICD-10-CM | POA: Diagnosis not present

## 2015-10-16 DIAGNOSIS — R296 Repeated falls: Secondary | ICD-10-CM | POA: Diagnosis not present

## 2015-10-16 DIAGNOSIS — R7303 Prediabetes: Secondary | ICD-10-CM | POA: Diagnosis not present

## 2015-10-16 DIAGNOSIS — M170 Bilateral primary osteoarthritis of knee: Secondary | ICD-10-CM | POA: Diagnosis not present

## 2015-10-16 DIAGNOSIS — F419 Anxiety disorder, unspecified: Secondary | ICD-10-CM | POA: Diagnosis not present

## 2015-10-16 DIAGNOSIS — J449 Chronic obstructive pulmonary disease, unspecified: Secondary | ICD-10-CM | POA: Diagnosis not present

## 2015-10-16 DIAGNOSIS — I10 Essential (primary) hypertension: Secondary | ICD-10-CM | POA: Diagnosis not present

## 2015-10-16 DIAGNOSIS — F329 Major depressive disorder, single episode, unspecified: Secondary | ICD-10-CM | POA: Diagnosis not present

## 2015-10-17 ENCOUNTER — Telehealth: Payer: Self-pay

## 2015-10-17 NOTE — Telephone Encounter (Signed)
Received order via fax for MSW [phone] and 1PRN for counseling needs.  Order numbered and forwarded to PCP for review and signature.

## 2015-10-18 ENCOUNTER — Other Ambulatory Visit: Payer: Self-pay | Admitting: Internal Medicine

## 2015-10-21 DIAGNOSIS — R296 Repeated falls: Secondary | ICD-10-CM | POA: Diagnosis not present

## 2015-10-21 DIAGNOSIS — R7303 Prediabetes: Secondary | ICD-10-CM | POA: Diagnosis not present

## 2015-10-21 DIAGNOSIS — F419 Anxiety disorder, unspecified: Secondary | ICD-10-CM | POA: Diagnosis not present

## 2015-10-21 DIAGNOSIS — R600 Localized edema: Secondary | ICD-10-CM | POA: Diagnosis not present

## 2015-10-21 DIAGNOSIS — D519 Vitamin B12 deficiency anemia, unspecified: Secondary | ICD-10-CM | POA: Diagnosis not present

## 2015-10-21 DIAGNOSIS — I10 Essential (primary) hypertension: Secondary | ICD-10-CM | POA: Diagnosis not present

## 2015-10-21 DIAGNOSIS — J449 Chronic obstructive pulmonary disease, unspecified: Secondary | ICD-10-CM | POA: Diagnosis not present

## 2015-10-21 DIAGNOSIS — F329 Major depressive disorder, single episode, unspecified: Secondary | ICD-10-CM | POA: Diagnosis not present

## 2015-10-21 DIAGNOSIS — M170 Bilateral primary osteoarthritis of knee: Secondary | ICD-10-CM | POA: Diagnosis not present

## 2015-10-24 DIAGNOSIS — D519 Vitamin B12 deficiency anemia, unspecified: Secondary | ICD-10-CM | POA: Diagnosis not present

## 2015-10-24 DIAGNOSIS — F419 Anxiety disorder, unspecified: Secondary | ICD-10-CM | POA: Diagnosis not present

## 2015-10-24 DIAGNOSIS — M170 Bilateral primary osteoarthritis of knee: Secondary | ICD-10-CM | POA: Diagnosis not present

## 2015-10-24 DIAGNOSIS — R296 Repeated falls: Secondary | ICD-10-CM | POA: Diagnosis not present

## 2015-10-24 DIAGNOSIS — R7303 Prediabetes: Secondary | ICD-10-CM | POA: Diagnosis not present

## 2015-10-24 DIAGNOSIS — F329 Major depressive disorder, single episode, unspecified: Secondary | ICD-10-CM | POA: Diagnosis not present

## 2015-10-24 DIAGNOSIS — R600 Localized edema: Secondary | ICD-10-CM | POA: Diagnosis not present

## 2015-10-24 DIAGNOSIS — J449 Chronic obstructive pulmonary disease, unspecified: Secondary | ICD-10-CM | POA: Diagnosis not present

## 2015-10-24 DIAGNOSIS — I10 Essential (primary) hypertension: Secondary | ICD-10-CM | POA: Diagnosis not present

## 2015-10-28 ENCOUNTER — Telehealth: Payer: Self-pay | Admitting: Internal Medicine

## 2015-10-28 ENCOUNTER — Other Ambulatory Visit: Payer: Self-pay | Admitting: Internal Medicine

## 2015-10-28 DIAGNOSIS — F419 Anxiety disorder, unspecified: Secondary | ICD-10-CM | POA: Diagnosis not present

## 2015-10-28 DIAGNOSIS — R296 Repeated falls: Secondary | ICD-10-CM | POA: Diagnosis not present

## 2015-10-28 DIAGNOSIS — J449 Chronic obstructive pulmonary disease, unspecified: Secondary | ICD-10-CM | POA: Diagnosis not present

## 2015-10-28 DIAGNOSIS — R600 Localized edema: Secondary | ICD-10-CM | POA: Diagnosis not present

## 2015-10-28 DIAGNOSIS — F329 Major depressive disorder, single episode, unspecified: Secondary | ICD-10-CM | POA: Diagnosis not present

## 2015-10-28 DIAGNOSIS — M170 Bilateral primary osteoarthritis of knee: Secondary | ICD-10-CM | POA: Diagnosis not present

## 2015-10-28 DIAGNOSIS — D519 Vitamin B12 deficiency anemia, unspecified: Secondary | ICD-10-CM | POA: Diagnosis not present

## 2015-10-28 DIAGNOSIS — R7303 Prediabetes: Secondary | ICD-10-CM | POA: Diagnosis not present

## 2015-10-28 DIAGNOSIS — I10 Essential (primary) hypertension: Secondary | ICD-10-CM | POA: Diagnosis not present

## 2015-10-28 NOTE — Telephone Encounter (Signed)
[facility] wanted to informed that patients visit for last week was missed but they will be seeing him this week for his final visit and d/c

## 2015-10-28 NOTE — Telephone Encounter (Signed)
Pt is requesting refill on Lorazepam.  Last OV: 09/18/2015  Last Fill: 08/21/2015 #60 and 2RF (Pt sig: 1 tab q4h prn) UDS: 09/01/2015 Low risk  Please advise.

## 2015-10-28 NOTE — Telephone Encounter (Signed)
Noted  

## 2015-10-28 NOTE — Telephone Encounter (Signed)
Too early, he should be okay at least until 11/21/2015

## 2015-10-29 ENCOUNTER — Telehealth: Payer: Self-pay | Admitting: Internal Medicine

## 2015-10-29 DIAGNOSIS — F419 Anxiety disorder, unspecified: Secondary | ICD-10-CM | POA: Diagnosis not present

## 2015-10-29 DIAGNOSIS — F329 Major depressive disorder, single episode, unspecified: Secondary | ICD-10-CM | POA: Diagnosis not present

## 2015-10-29 DIAGNOSIS — D519 Vitamin B12 deficiency anemia, unspecified: Secondary | ICD-10-CM | POA: Diagnosis not present

## 2015-10-29 DIAGNOSIS — M170 Bilateral primary osteoarthritis of knee: Secondary | ICD-10-CM | POA: Diagnosis not present

## 2015-10-29 DIAGNOSIS — R296 Repeated falls: Secondary | ICD-10-CM | POA: Diagnosis not present

## 2015-10-29 DIAGNOSIS — R600 Localized edema: Secondary | ICD-10-CM | POA: Diagnosis not present

## 2015-10-29 DIAGNOSIS — I10 Essential (primary) hypertension: Secondary | ICD-10-CM | POA: Diagnosis not present

## 2015-10-29 DIAGNOSIS — J449 Chronic obstructive pulmonary disease, unspecified: Secondary | ICD-10-CM | POA: Diagnosis not present

## 2015-10-29 DIAGNOSIS — R7303 Prediabetes: Secondary | ICD-10-CM | POA: Diagnosis not present

## 2015-10-29 NOTE — Telephone Encounter (Signed)
Called pt, he says that he was told by pharmacy that he doesn't have any refills available. He would like to know if someone could call pharmacy to confirm. Pt would like to be advised further.

## 2015-10-29 NOTE — Telephone Encounter (Signed)
Pt called to follow up on refill request. Informed him of message from provider. Pt says that he only has 4 pills left. Pt says that he doesn't abuse his medication, he take them as prescribed.    Please advise further.

## 2015-10-29 NOTE — Telephone Encounter (Signed)
Refills were faxed to pharmacy on 08/21/2015 #60 tabs and 2 refills. Pt should still have the last refill remaining. PCP is out of office until Monday, September 11.

## 2015-10-30 MED ORDER — LORAZEPAM 0.5 MG PO TABS: 0.5000 mg | ORAL_TABLET | ORAL | 0 refills | Status: DC | PRN

## 2015-10-30 NOTE — Addendum Note (Signed)
Addended byDamita Dunnings D on: 10/30/2015 08:08 AM   Modules accepted: Orders

## 2015-10-30 NOTE — Telephone Encounter (Signed)
[facility], Pt picked up Rx on 08/21/2015, 09/09/2015, and 10/06/2015 since Pt's sig is 1 tab q4h PRN (6 tabs daily). Verbal given to fill #60 tabs only.

## 2015-11-03 ENCOUNTER — Ambulatory Visit: Payer: Commercial Managed Care - HMO | Admitting: Internal Medicine

## 2015-11-03 ENCOUNTER — Telehealth: Payer: Self-pay | Admitting: Internal Medicine

## 2015-11-03 NOTE — Telephone Encounter (Signed)
No , is ok  

## 2015-11-03 NOTE — Telephone Encounter (Signed)
Pt called in at 8:28 to reschedule his appt. Pt says that he has diarrhea really bad and is unable to make it.  Pt has rescheduled his appt for Wednesday at 10:45.    Should pt be charged?

## 2015-11-06 ENCOUNTER — Ambulatory Visit: Payer: Commercial Managed Care - HMO | Admitting: Internal Medicine

## 2015-11-08 DIAGNOSIS — N3289 Other specified disorders of bladder: Secondary | ICD-10-CM | POA: Diagnosis not present

## 2015-11-08 DIAGNOSIS — R39198 Other difficulties with micturition: Secondary | ICD-10-CM | POA: Diagnosis not present

## 2015-11-08 DIAGNOSIS — N1330 Unspecified hydronephrosis: Secondary | ICD-10-CM | POA: Diagnosis not present

## 2015-11-08 DIAGNOSIS — Z79899 Other long term (current) drug therapy: Secondary | ICD-10-CM | POA: Diagnosis not present

## 2015-11-08 DIAGNOSIS — R52 Pain, unspecified: Secondary | ICD-10-CM | POA: Diagnosis not present

## 2015-11-08 DIAGNOSIS — R109 Unspecified abdominal pain: Secondary | ICD-10-CM | POA: Diagnosis not present

## 2015-11-08 DIAGNOSIS — R339 Retention of urine, unspecified: Secondary | ICD-10-CM | POA: Diagnosis not present

## 2015-11-08 DIAGNOSIS — K769 Liver disease, unspecified: Secondary | ICD-10-CM | POA: Diagnosis not present

## 2015-11-08 DIAGNOSIS — I10 Essential (primary) hypertension: Secondary | ICD-10-CM | POA: Diagnosis not present

## 2015-11-08 DIAGNOSIS — R338 Other retention of urine: Secondary | ICD-10-CM | POA: Diagnosis not present

## 2015-11-08 DIAGNOSIS — N400 Enlarged prostate without lower urinary tract symptoms: Secondary | ICD-10-CM | POA: Diagnosis not present

## 2015-11-08 DIAGNOSIS — R319 Hematuria, unspecified: Secondary | ICD-10-CM | POA: Diagnosis not present

## 2015-11-08 DIAGNOSIS — Z880 Allergy status to penicillin: Secondary | ICD-10-CM | POA: Diagnosis not present

## 2015-11-08 DIAGNOSIS — Z7982 Long term (current) use of aspirin: Secondary | ICD-10-CM | POA: Diagnosis not present

## 2015-11-08 DIAGNOSIS — Z87891 Personal history of nicotine dependence: Secondary | ICD-10-CM | POA: Diagnosis not present

## 2015-11-10 ENCOUNTER — Telehealth: Payer: Self-pay | Admitting: Internal Medicine

## 2015-11-10 DIAGNOSIS — R911 Solitary pulmonary nodule: Secondary | ICD-10-CM

## 2015-11-10 MED ORDER — HYDROCODONE-ACETAMINOPHEN 7.5-300 MG PO TABS
1.0000 | ORAL_TABLET | Freq: Three times a day (TID) | ORAL | 0 refills | Status: DC | PRN
Start: 2015-11-10 — End: 2015-11-17

## 2015-11-10 NOTE — Telephone Encounter (Signed)
Pt is requesting refill on Hydrocodone.  Last OV: 09/18/2015  Last Fill: 09/01/2015 #90 and 0RF For July and August 2017 UDS: 09/01/2015 Low risk  Please advise.

## 2015-11-10 NOTE — Telephone Encounter (Signed)
Okay #90, no refills 

## 2015-11-10 NOTE — Telephone Encounter (Signed)
Please inform Pt that Rx has been placed at front desk for pick up at his convenience. Thank you.  

## 2015-11-10 NOTE — Telephone Encounter (Signed)
Informed patient that Rx is at the front desk.

## 2015-11-10 NOTE — Telephone Encounter (Signed)
Rx printed, awaiting MD signature.  

## 2015-11-10 NOTE — Telephone Encounter (Signed)
°  Relationship to patient: Self  Can be reached: [phone]   Reason for call:Request refill on Hydrocodone-Acetaminophen 7.5-300 MG TABS [id number]

## 2015-11-12 ENCOUNTER — Ambulatory Visit: Payer: Commercial Managed Care - HMO | Admitting: Internal Medicine

## 2015-11-12 DIAGNOSIS — R338 Other retention of urine: Secondary | ICD-10-CM | POA: Diagnosis not present

## 2015-11-12 DIAGNOSIS — N401 Enlarged prostate with lower urinary tract symptoms: Secondary | ICD-10-CM | POA: Diagnosis not present

## 2015-11-15 DIAGNOSIS — Z87891 Personal history of nicotine dependence: Secondary | ICD-10-CM | POA: Diagnosis not present

## 2015-11-15 DIAGNOSIS — R1030 Lower abdominal pain, unspecified: Secondary | ICD-10-CM | POA: Diagnosis not present

## 2015-11-15 DIAGNOSIS — I10 Essential (primary) hypertension: Secondary | ICD-10-CM | POA: Diagnosis not present

## 2015-11-15 DIAGNOSIS — Z79899 Other long term (current) drug therapy: Secondary | ICD-10-CM | POA: Diagnosis not present

## 2015-11-15 DIAGNOSIS — R339 Retention of urine, unspecified: Secondary | ICD-10-CM | POA: Diagnosis not present

## 2015-11-15 DIAGNOSIS — Z880 Allergy status to penicillin: Secondary | ICD-10-CM | POA: Diagnosis not present

## 2015-11-15 DIAGNOSIS — T83098A Other mechanical complication of other indwelling urethral catheter, initial encounter: Secondary | ICD-10-CM | POA: Diagnosis not present

## 2015-11-15 DIAGNOSIS — Z7982 Long term (current) use of aspirin: Secondary | ICD-10-CM | POA: Diagnosis not present

## 2015-11-16 DIAGNOSIS — R338 Other retention of urine: Secondary | ICD-10-CM | POA: Diagnosis not present

## 2015-11-16 DIAGNOSIS — T83098A Other mechanical complication of other indwelling urethral catheter, initial encounter: Secondary | ICD-10-CM | POA: Diagnosis not present

## 2015-11-16 DIAGNOSIS — T83011A Breakdown (mechanical) of indwelling urethral catheter, initial encounter: Secondary | ICD-10-CM | POA: Diagnosis not present

## 2015-11-16 DIAGNOSIS — I10 Essential (primary) hypertension: Secondary | ICD-10-CM | POA: Diagnosis not present

## 2015-11-16 DIAGNOSIS — N401 Enlarged prostate with lower urinary tract symptoms: Secondary | ICD-10-CM | POA: Diagnosis not present

## 2015-11-16 DIAGNOSIS — N1330 Unspecified hydronephrosis: Secondary | ICD-10-CM | POA: Diagnosis not present

## 2015-11-16 DIAGNOSIS — Z880 Allergy status to penicillin: Secondary | ICD-10-CM | POA: Diagnosis not present

## 2015-11-16 DIAGNOSIS — R339 Retention of urine, unspecified: Secondary | ICD-10-CM | POA: Diagnosis not present

## 2015-11-16 DIAGNOSIS — Z79899 Other long term (current) drug therapy: Secondary | ICD-10-CM | POA: Diagnosis not present

## 2015-11-17 ENCOUNTER — Encounter: Payer: Self-pay | Admitting: Internal Medicine

## 2015-11-17 ENCOUNTER — Ambulatory Visit (INDEPENDENT_AMBULATORY_CARE_PROVIDER_SITE_OTHER): Payer: Commercial Managed Care - HMO | Admitting: Internal Medicine

## 2015-11-17 VITALS — BP 132/78 | HR 68 | Temp 97.8°F | Resp 14 | Ht 61.0 in | Wt 238.0 lb

## 2015-11-17 DIAGNOSIS — I10 Essential (primary) hypertension: Secondary | ICD-10-CM | POA: Diagnosis not present

## 2015-11-17 DIAGNOSIS — R627 Adult failure to thrive: Secondary | ICD-10-CM | POA: Diagnosis not present

## 2015-11-17 DIAGNOSIS — R911 Solitary pulmonary nodule: Secondary | ICD-10-CM

## 2015-11-17 DIAGNOSIS — M545 Low back pain: Secondary | ICD-10-CM | POA: Diagnosis not present

## 2015-11-17 DIAGNOSIS — R339 Retention of urine, unspecified: Secondary | ICD-10-CM | POA: Diagnosis not present

## 2015-11-17 MED ORDER — HYDROCODONE-ACETAMINOPHEN 7.5-325 MG PO TABS: 1.0000 | ORAL_TABLET | Freq: Three times a day (TID) | ORAL | 0 refills | Status: DC | PRN

## 2015-11-17 NOTE — Assessment & Plan Note (Signed)
Urinary retention: onset 11/07/2015, went to the ER, labs were okay, saw urology 11/12/2015, Proscar was added to Flomax, was recommended to return to the office in one week. Went to the ER 11/12/2015 complaining of no urinary flow, foley was  no changed but repositioned; ER again 11/15/2015: foley replaced, today is doing ok, rec to see urology as rx. His urologist would like cardiac clearance if TURP is needed. Will need cardiology eval HTN: Recent BMP and CBC satisfactory. Continue carvedilol, Lasix, potassium Edema: on  Lasix and potassium. Seems at baseline Chronic back pain: Change hydrocodone from 7.5-300  to 7.5-325 due to cost. Rx issued Lung nodule: Due for a CT chest, the patient likes to take care of his urinary symptoms before he proceed with anything else. Failure to thrive : patient is depending more heavily on his family for ADL, a letter of support is provided. RTC 3 months

## 2015-11-17 NOTE — Patient Instructions (Signed)
Next visit in 3 months  

## 2015-11-17 NOTE — Progress Notes (Signed)
Subjective:    Patient ID: Wayne Nash, male    DOB: 19-Mar-1933, 82 y.o.   MRN: [id number]  DOS:  11/17/2015 Type of visit - description : rov Interval history: Since the last office visit, he developed a urinary retention 11/07/2015, since then he has been in the ER 3 times and visited  urology. Last ER visit was last night due to catheter malfunction, it was changed. This morning he reports the catheter is working well. The 3 ER visit and  urology note  were reviewed.   Review of Systems Denies chest pain or difficulty breathing No fever chills No abdominal pain No cough No blood in the stools. Lower extremity edema at baseline lives by himself, able to do ADLs but having a more difficult time, has a helper , Wayne Nash helps a lot Wayne Nash    Past Medical History:  Diagnosis Date  . Hyperlipidemia   . Hypertension   . Primary osteoarthritis of left knee   . Primary osteoarthritis of right knee     Past Surgical History:  Procedure Laterality Date  . APPENDECTOMY    . LUMBAR DISC SURGERY     Dr Shellia Carwin  . SKIN BIOPSY      Social History   Social History  . Marital status: Divorced    Spouse name: N/A  . Number of children: 3  . Years of education: N/A   Occupational History  . 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 Smoking status: Former Smoker    Quit date: 02/22/1994  . Smokeless tobacco: Never Used     Comment: smoked 1953-1996, up to 1.5 ppd  . Alcohol use No     Comment:  quit 1985  . Drug use: No  . Sexual activity: Not on file   Other Topics Concern  . Not on file   Social History Narrative   3 children, all live within 20 miles   7 Gc, 3 GGchildren   lives by himself          Medication List       Accurate as of 11/17/15  9:00 AM. Always use your most recent med list.          aspirin 81 MG tablet Take 81 mg by mouth daily.   atorvastatin 20  MG tablet Commonly known as:  LIPITOR Take 1 tablet (20 mg total) by mouth daily.   carvedilol 6.25 MG tablet Commonly known as:  COREG TAKE 1 TABLET BY MOUTH EVERY DAY   ciprofloxacin 500 MG tablet Commonly known as:  CIPRO Take 500 mg by mouth 2 (two) times daily.   cyclobenzaprine 5 MG tablet Commonly known as:  FLEXERIL Take 1 tablet (5 mg total) by mouth at bedtime as needed for muscle spasms.   doxepin 10 MG capsule Commonly known as:  SINEQUAN Take 1 capsule (10 mg total) by mouth at bedtime as needed.   escitalopram 10 MG tablet Commonly known as:  LEXAPRO Take 1 tablet (10 mg total) by mouth daily.   furosemide 20 MG tablet Commonly known as:  LASIX Take 2 tablets (40 mg total) by mouth daily.   gabapentin 300 MG capsule Commonly known as:  NEURONTIN Take 1 capsule (300 mg total) by mouth 2 (two) times daily.   Hydrocodone-Acetaminophen 7.5-300 MG Tabs Take 1 tablet by mouth 3 (three) times daily as needed.   LORazepam 0.5 MG tablet  Commonly known as:  ATIVAN Take 1 tablet (0.5 mg total) by mouth every 4 (four) hours as needed.   potassium chloride 10 MEQ tablet Commonly known as:  K-DUR Take 1 tablet (10 mEq total) by mouth daily.   tamsulosin 0.4 MG Caps capsule Commonly known as:  FLOMAX Take 1 capsule (0.4 mg total) by mouth daily.   tiotropium 18 MCG inhalation capsule Commonly known as:  SPIRIVA Place 1 capsule (18 mcg total) into inhaler and inhale daily.          Objective:   Physical Exam BP 132/78 (BP Location: Left Arm, Patient Position: Sitting, Cuff Size: Normal)   Pulse 68   Temp 97.8 F (36.6 C) (Oral)   Resp 14   Ht 5\' 1"  (1.549 m)   Wt 238 lb (108 kg)   SpO2 92%   BMI 44.97 kg/m  General:   Well developed, well nourished . NAD. Sitting in a wheelchair HEENT:  Normocephalic . Face symmetric, atraumatic Lungs:  CTA B Normal respiratory effort, no intercostal retractions, no accessory muscle use. Heart: RRR,  no murmur.    ++/+++ pretibial edema bilaterally , seems at baseline Skin: Not pale. Not jaundice Neurologic:  alert & oriented X3.  Speech normal Psych--  Cognition and judgment appear intact.  Cooperative with normal attention span and concentration.  Behavior appropriate. No anxious or depressed appearing.      Assessment & Plan:   Assessment>  Prediabetes  HTN EDEMA, LE, chronic (echo 09-2013 ok, [place] 03-2014 no DVT) Hyperlipidemia Anxiety, insomnia --prn doxepin,ativan B12 deficiency COPD (dx 2016: former smoker, DOE, PFTs rx but not done as off 03-2015) MSK --Gait d/o likely multifactorial --Chronic back pain--- hydrocodone (UDS), started gabapentin 12-2014 --DJD Rash (persistent throughout 2015--scabies) Pulmonary Nodule, last CT  09-2014 recheck 1 year  H/o colelithiasis Urinary retention: Onset 11/07/2015, urology Dr. Burt Knack. Family contact  Wayne Nash [phone]   PLAN Urinary retention: onset 11/07/2015, went to the ER, labs were okay, saw urology 11/12/2015, Proscar was added to Flomax, was recommended to return to the office in one week. Went to the ER 11/12/2015 complaining of no urinary flow, foley was  no changed but repositioned; ER again 11/15/2015: foley replaced, today is doing ok, rec to see urology as rx. His urologist would like cardiac clearance if TURP is needed. Will need cardiology eval HTN: Recent BMP and CBC satisfactory. Continue carvedilol, Lasix, potassium Edema: on  Lasix and potassium. Seems at baseline Chronic back pain: Change hydrocodone from 7.5-300  to 7.5-325 due to cost. Rx issued Lung nodule: Due for a CT chest, the patient likes to take care of his urinary symptoms before he proceed with anything else. Failure to thrive : patient is depending more heavily on his family for ADL, a letter of support is provided. RTC 3 months

## 2015-11-17 NOTE — Progress Notes (Signed)
Pre visit review using our clinic review tool, if applicable. No additional management support is needed unless otherwise documented below in the visit note. 

## 2015-11-18 ENCOUNTER — Telehealth: Payer: Self-pay | Admitting: Internal Medicine

## 2015-11-18 DIAGNOSIS — F329 Major depressive disorder, single episode, unspecified: Secondary | ICD-10-CM | POA: Diagnosis not present

## 2015-11-18 DIAGNOSIS — F419 Anxiety disorder, unspecified: Secondary | ICD-10-CM | POA: Diagnosis not present

## 2015-11-18 DIAGNOSIS — Z466 Encounter for fitting and adjustment of urinary device: Secondary | ICD-10-CM | POA: Diagnosis not present

## 2015-11-18 DIAGNOSIS — R339 Retention of urine, unspecified: Secondary | ICD-10-CM | POA: Diagnosis not present

## 2015-11-18 DIAGNOSIS — M170 Bilateral primary osteoarthritis of knee: Secondary | ICD-10-CM | POA: Diagnosis not present

## 2015-11-18 NOTE — Telephone Encounter (Signed)
LMOM instructing Dorian Pod to return call.

## 2015-11-18 NOTE — Telephone Encounter (Signed)
Caller name: Dorian Pod Relationship to patient: [facility] Can be reached: [phone] Pharmacy:  Reason for call: Needs an order for ALL home health services. States patient was seen this morning for a Nurse visit only but after accessing patient it was determined that he has many other needs.

## 2015-11-19 DIAGNOSIS — R339 Retention of urine, unspecified: Secondary | ICD-10-CM | POA: Diagnosis not present

## 2015-11-19 DIAGNOSIS — Z466 Encounter for fitting and adjustment of urinary device: Secondary | ICD-10-CM | POA: Diagnosis not present

## 2015-11-19 DIAGNOSIS — F329 Major depressive disorder, single episode, unspecified: Secondary | ICD-10-CM | POA: Diagnosis not present

## 2015-11-19 DIAGNOSIS — M170 Bilateral primary osteoarthritis of knee: Secondary | ICD-10-CM | POA: Diagnosis not present

## 2015-11-19 DIAGNOSIS — F419 Anxiety disorder, unspecified: Secondary | ICD-10-CM | POA: Diagnosis not present

## 2015-11-19 NOTE — Telephone Encounter (Signed)
Noted  

## 2015-11-19 NOTE — Telephone Encounter (Signed)
Wayne Nash called back to say it is fine and that she will put in the order for the pt.

## 2015-11-20 DIAGNOSIS — Z466 Encounter for fitting and adjustment of urinary device: Secondary | ICD-10-CM | POA: Diagnosis not present

## 2015-11-20 DIAGNOSIS — M170 Bilateral primary osteoarthritis of knee: Secondary | ICD-10-CM | POA: Diagnosis not present

## 2015-11-20 DIAGNOSIS — F419 Anxiety disorder, unspecified: Secondary | ICD-10-CM | POA: Diagnosis not present

## 2015-11-20 DIAGNOSIS — F329 Major depressive disorder, single episode, unspecified: Secondary | ICD-10-CM | POA: Diagnosis not present

## 2015-11-20 DIAGNOSIS — R339 Retention of urine, unspecified: Secondary | ICD-10-CM | POA: Diagnosis not present

## 2015-11-21 DIAGNOSIS — M170 Bilateral primary osteoarthritis of knee: Secondary | ICD-10-CM | POA: Diagnosis not present

## 2015-11-21 DIAGNOSIS — Z466 Encounter for fitting and adjustment of urinary device: Secondary | ICD-10-CM | POA: Diagnosis not present

## 2015-11-21 DIAGNOSIS — R339 Retention of urine, unspecified: Secondary | ICD-10-CM | POA: Diagnosis not present

## 2015-11-21 DIAGNOSIS — F419 Anxiety disorder, unspecified: Secondary | ICD-10-CM | POA: Diagnosis not present

## 2015-11-21 DIAGNOSIS — F329 Major depressive disorder, single episode, unspecified: Secondary | ICD-10-CM | POA: Diagnosis not present

## 2015-11-22 NOTE — Telephone Encounter (Signed)
Put ct of chest to repeat based on nodule seen last year. I put order in. Please coordniate with radiology on scheduling. Will you let me know what date ct will be done. Thanks.

## 2015-11-24 ENCOUNTER — Telehealth: Payer: Self-pay | Admitting: Internal Medicine

## 2015-11-24 ENCOUNTER — Ambulatory Visit: Payer: Commercial Managed Care - HMO | Admitting: Internal Medicine

## 2015-11-24 DIAGNOSIS — F419 Anxiety disorder, unspecified: Secondary | ICD-10-CM | POA: Diagnosis not present

## 2015-11-24 DIAGNOSIS — M170 Bilateral primary osteoarthritis of knee: Secondary | ICD-10-CM | POA: Diagnosis not present

## 2015-11-24 DIAGNOSIS — R339 Retention of urine, unspecified: Secondary | ICD-10-CM | POA: Diagnosis not present

## 2015-11-24 DIAGNOSIS — Z466 Encounter for fitting and adjustment of urinary device: Secondary | ICD-10-CM | POA: Diagnosis not present

## 2015-11-24 DIAGNOSIS — F329 Major depressive disorder, single episode, unspecified: Secondary | ICD-10-CM | POA: Diagnosis not present

## 2015-11-24 NOTE — Telephone Encounter (Signed)
Form's for [facility] completed and faxed along w/ OV notes from 09/18/2015 and 11/17/2015 to [phone]. Order forms sent for scanning.

## 2015-11-24 NOTE — Telephone Encounter (Signed)
Spoke w/ [facility]. Pt is very weak and they are requesting orders for wheelchair to be sent to [facility].

## 2015-11-24 NOTE — Telephone Encounter (Signed)
Received fax confirmation on 11/24/2015 at 1453.

## 2015-11-24 NOTE — Telephone Encounter (Addendum)
Order was placed for CT chest. Will you let me know when scheduled. Who contacts pt radiology or [place].

## 2015-11-24 NOTE — Telephone Encounter (Signed)
Pending insurance auth, Radiology will contact patient once approved

## 2015-11-24 NOTE — Telephone Encounter (Signed)
Caller name:Ayiae Relation to pt: PT from [facility] Call back number: [phone]    Reason for call:  PT would like to discuss patient ordering a wheelchair. Please advise

## 2015-11-25 DIAGNOSIS — Z466 Encounter for fitting and adjustment of urinary device: Secondary | ICD-10-CM | POA: Diagnosis not present

## 2015-11-25 DIAGNOSIS — F419 Anxiety disorder, unspecified: Secondary | ICD-10-CM | POA: Diagnosis not present

## 2015-11-25 DIAGNOSIS — M170 Bilateral primary osteoarthritis of knee: Secondary | ICD-10-CM | POA: Diagnosis not present

## 2015-11-25 DIAGNOSIS — R339 Retention of urine, unspecified: Secondary | ICD-10-CM | POA: Diagnosis not present

## 2015-11-25 DIAGNOSIS — F329 Major depressive disorder, single episode, unspecified: Secondary | ICD-10-CM | POA: Diagnosis not present

## 2015-11-26 ENCOUNTER — Telehealth: Payer: Self-pay | Admitting: Internal Medicine

## 2015-11-26 DIAGNOSIS — N401 Enlarged prostate with lower urinary tract symptoms: Secondary | ICD-10-CM | POA: Diagnosis not present

## 2015-11-26 DIAGNOSIS — Z87891 Personal history of nicotine dependence: Secondary | ICD-10-CM | POA: Diagnosis not present

## 2015-11-26 DIAGNOSIS — R300 Dysuria: Secondary | ICD-10-CM | POA: Diagnosis not present

## 2015-11-26 DIAGNOSIS — Z79899 Other long term (current) drug therapy: Secondary | ICD-10-CM | POA: Diagnosis not present

## 2015-11-26 DIAGNOSIS — R0902 Hypoxemia: Secondary | ICD-10-CM | POA: Diagnosis not present

## 2015-11-26 DIAGNOSIS — Z880 Allergy status to penicillin: Secondary | ICD-10-CM | POA: Diagnosis not present

## 2015-11-26 DIAGNOSIS — Z7982 Long term (current) use of aspirin: Secondary | ICD-10-CM | POA: Diagnosis not present

## 2015-11-26 DIAGNOSIS — N4889 Other specified disorders of penis: Secondary | ICD-10-CM | POA: Diagnosis not present

## 2015-11-26 DIAGNOSIS — I10 Essential (primary) hypertension: Secondary | ICD-10-CM | POA: Diagnosis not present

## 2015-11-26 DIAGNOSIS — R339 Retention of urine, unspecified: Secondary | ICD-10-CM | POA: Diagnosis not present

## 2015-11-26 NOTE — Telephone Encounter (Signed)
Patient may be reached at [phone]

## 2015-11-26 NOTE — Telephone Encounter (Signed)
Caller name: Relationship to patient: Self Can be reached:  Pharmacy:  Reason for call: Patient request refill on HYDROcodone-acetaminophen (NORCO) 7.5-325 MG tablet [id number]

## 2015-11-27 ENCOUNTER — Telehealth: Payer: Self-pay | Admitting: Behavioral Health

## 2015-11-27 DIAGNOSIS — F329 Major depressive disorder, single episode, unspecified: Secondary | ICD-10-CM | POA: Diagnosis not present

## 2015-11-27 DIAGNOSIS — M170 Bilateral primary osteoarthritis of knee: Secondary | ICD-10-CM | POA: Diagnosis not present

## 2015-11-27 DIAGNOSIS — R339 Retention of urine, unspecified: Secondary | ICD-10-CM | POA: Diagnosis not present

## 2015-11-27 DIAGNOSIS — Z466 Encounter for fitting and adjustment of urinary device: Secondary | ICD-10-CM | POA: Diagnosis not present

## 2015-11-27 DIAGNOSIS — F419 Anxiety disorder, unspecified: Secondary | ICD-10-CM | POA: Diagnosis not present

## 2015-11-27 NOTE — Telephone Encounter (Signed)
Nancy Hickmom (Occupational Therapist) [facility] Lemmings called because she was in the home with the patient and he was complaining about pain again. Also states that patient went to ER last night because he had blood in his urine.She is requesting a call back because she does not understand what he did with 90 pills since 9/25. Her number is [phone].

## 2015-11-27 NOTE — Telephone Encounter (Signed)
Rx was given to Pt and his grand-daughter at 11/17/2015 OV. He will need to use that prescription.

## 2015-11-27 NOTE — Telephone Encounter (Signed)
Patient's granddaughter, Wayne Nash voiced that last night her grandfather was taken to the ER due to issues with urinating and had catheter placed. She stated, "that he just recently had it removed after four weeks and she's concerned that he's not any better than before". Wayne Nash reports that this afternoon he seems more agitated, confused, forgetful, and experiencing discomfort as opposed to earlier when the home health nurse came out to see him. Offered to schedule an appointment today for possible UTI, but the granddaughter declined, verbalizing that she's not with him right now, but on the way to his home to see how the patient is doing. She verbalized that he's already going to see his Urologist on tomorrow. Advised the granddaughter to seek care at the Emergency Department if his condition worsens. She understood and did not have any further questions. Message routed to PCP for review.

## 2015-11-27 NOTE — Telephone Encounter (Signed)
Spoke to [employer] (Warden/ranger) who states that she is concerned with pt's pain level with his catheter. Per Izora Gala pt rates his pain as 10 out of 10. Pt told Izora Gala that he was seen in the ER last night due to blood being in his urine but Izora Gala was told by pt that he forgot the name of the hospital.  Izora Gala is also concerned with the number of Hydrocodone pills pt has left. Last refill was on 9/25 for 90 pills and pt only has 4 pills left today. Izora Gala is concerned with what's happening to the pills.Izora Gala wonders for pt's future refills can we only give him 30 pills instead of 63. Please advise.

## 2015-11-28 DIAGNOSIS — N400 Enlarged prostate without lower urinary tract symptoms: Secondary | ICD-10-CM | POA: Diagnosis not present

## 2015-11-28 DIAGNOSIS — M170 Bilateral primary osteoarthritis of knee: Secondary | ICD-10-CM | POA: Diagnosis not present

## 2015-11-28 DIAGNOSIS — R339 Retention of urine, unspecified: Secondary | ICD-10-CM | POA: Diagnosis not present

## 2015-11-28 DIAGNOSIS — Z9289 Personal history of other medical treatment: Secondary | ICD-10-CM | POA: Diagnosis not present

## 2015-11-28 DIAGNOSIS — F419 Anxiety disorder, unspecified: Secondary | ICD-10-CM | POA: Diagnosis not present

## 2015-11-28 DIAGNOSIS — I10 Essential (primary) hypertension: Secondary | ICD-10-CM | POA: Diagnosis not present

## 2015-11-28 DIAGNOSIS — F329 Major depressive disorder, single episode, unspecified: Secondary | ICD-10-CM | POA: Diagnosis not present

## 2015-11-28 DIAGNOSIS — Z466 Encounter for fitting and adjustment of urinary device: Secondary | ICD-10-CM | POA: Diagnosis not present

## 2015-11-28 DIAGNOSIS — R338 Other retention of urine: Secondary | ICD-10-CM | POA: Diagnosis not present

## 2015-11-28 NOTE — Telephone Encounter (Signed)
Noted, thank you. Please check on him Monday

## 2015-11-28 NOTE — Telephone Encounter (Signed)
Review concerns from the occupational therapist, very valuable  information. Thank you.

## 2015-12-01 DIAGNOSIS — Z466 Encounter for fitting and adjustment of urinary device: Secondary | ICD-10-CM | POA: Diagnosis not present

## 2015-12-01 DIAGNOSIS — F329 Major depressive disorder, single episode, unspecified: Secondary | ICD-10-CM | POA: Diagnosis not present

## 2015-12-01 DIAGNOSIS — M170 Bilateral primary osteoarthritis of knee: Secondary | ICD-10-CM | POA: Diagnosis not present

## 2015-12-01 DIAGNOSIS — R339 Retention of urine, unspecified: Secondary | ICD-10-CM | POA: Diagnosis not present

## 2015-12-01 DIAGNOSIS — F419 Anxiety disorder, unspecified: Secondary | ICD-10-CM | POA: Diagnosis not present

## 2015-12-01 NOTE — Telephone Encounter (Signed)
He got hydrocodone #90 on 11/17/2015,   he can take it 3 times a day, thus is too early to refill.

## 2015-12-01 NOTE — Telephone Encounter (Addendum)
Called and follow-up with the patient. He voiced that things went well at his urology appointment on Friday and will see the provider again on 12/08/15. Also, patient stated, "I still have the catheter and tolerating it, however the pain continues and I was not given anything for it in the hospital". Patient has been prescribed hydrocodone, but said that he was out of the medication. Reviewed patient's chart and the medication was filled last month, 11/17/15. He's requesting another medication for pain. Please advise.

## 2015-12-02 ENCOUNTER — Telehealth: Payer: Self-pay | Admitting: Internal Medicine

## 2015-12-02 DIAGNOSIS — R339 Retention of urine, unspecified: Secondary | ICD-10-CM | POA: Diagnosis not present

## 2015-12-02 DIAGNOSIS — M170 Bilateral primary osteoarthritis of knee: Secondary | ICD-10-CM | POA: Diagnosis not present

## 2015-12-02 DIAGNOSIS — F419 Anxiety disorder, unspecified: Secondary | ICD-10-CM | POA: Diagnosis not present

## 2015-12-02 DIAGNOSIS — F329 Major depressive disorder, single episode, unspecified: Secondary | ICD-10-CM | POA: Diagnosis not present

## 2015-12-02 DIAGNOSIS — Z466 Encounter for fitting and adjustment of urinary device: Secondary | ICD-10-CM | POA: Diagnosis not present

## 2015-12-02 NOTE — Telephone Encounter (Signed)
Received a fax from home health: Vital signs for 11/27/2015: Pulse 78, blood pressure 142/80, O2 sat 92%.

## 2015-12-02 NOTE — Telephone Encounter (Signed)
Do you know anything regarding this? I have reviewed telephone notes from 10/4 and 10/5.

## 2015-12-02 NOTE — Telephone Encounter (Signed)
Caller name: Relationship to patient: Alvis Lemmings Can be reached: [phone] Pharmacy:  Reason for call: FYI: Dorian Pod with [employer] called stating that patient called their office to informed them that he was going to kill himself because they told him his services had been put on hold. Nurse went out to see him and patient seems to be fine. Nurse is hoping that provider can talk with patient. Nurse feels that patient does not need to be living alone and they can not go back until his insurance approves.

## 2015-12-02 NOTE — Telephone Encounter (Signed)
I spoke with the patient: States that he was very upset because he heard that I would not prescribe more hydrocodone and the nurses won't go back to see him; he reports is very scare, feels like he won't get the help he needs. Under those circumstances is when he mention something about suicide, he states he said  "I might as well kill myself".  He clearly stated to me he is not suicidal, and was speaking figuratively. I asked him about the pain medications, he said he has ~ 11 tablets left, he should have more if he takes them as prescribed, "I don't know where they are, a lot of people come and go in my house. I have certainly not taken that many". States that hydrocodone is helping him tremendously with pain "it keeps me going".   We agreed on the following 1. He will call if he needs help 2. He will come to the office 12/04/2015 to be pick up an aditional hydrocodone prescription, will be #45, no refills, two-week supply. He must keep the medications close to him. 3. I will call the home health agency tomorrow and see if he can be seen again. At the end, he said he was very thankful I called him

## 2015-12-02 NOTE — Telephone Encounter (Signed)
Relation to [id number] Call back number:[phone]   Reason for call:  Patient states Wayne Nash informed him today a home health nurse will no longer be providing home health aid assistance.  patient is very upest because he states he's still using a urine catheter. Patient asked Wayne Nash why and they informed "its coming in from the higher ups" please advise.

## 2015-12-03 NOTE — Telephone Encounter (Signed)
See Telephone Note on 12/02/15. Thanks.

## 2015-12-03 NOTE — Telephone Encounter (Addendum)
Spoke with Dorian Pod at [phone]: -They are trying to get [employer] silver back to ok his nurse visits however at most they will be able to get 2 more weekly visits as the patient is stable and able to self manage his catheter. They will call me if I can do anything to help. -He will be able to get more PT/OT [place]---- Please call the patient and notify off above

## 2015-12-03 NOTE — Telephone Encounter (Signed)
Patient was made aware of the provider's recommendations below and verbalized understanding. He did not have any further concerns.

## 2015-12-04 ENCOUNTER — Telehealth: Payer: Self-pay | Admitting: Internal Medicine

## 2015-12-04 MED ORDER — HYDROCODONE-ACETAMINOPHEN 7.5-325 MG PO TABS: 1.0000 | ORAL_TABLET | Freq: Three times a day (TID) | ORAL | 0 refills | Status: DC | PRN

## 2015-12-04 NOTE — Telephone Encounter (Signed)
Caller name: Relationship to patient: Self Can be reached: [phone] Pharmacy:  Reason for call: Refill HYDROcodone-acetaminophen ([place]) 7.5-325 MG tablet [id number]

## 2015-12-04 NOTE — Telephone Encounter (Signed)
Called pt's grand daughter. lvm to make her aware that Rx is ready for pick up.

## 2015-12-04 NOTE — Telephone Encounter (Signed)
Patient's granddaughter Larene Beach would like to be called when this medication is ready to be picked up. She is the one that will be coming to get it.   Phone: [phone]

## 2015-12-04 NOTE — Telephone Encounter (Signed)
Prescription for #45 printed. Will leave at the front desk

## 2015-12-05 ENCOUNTER — Telehealth: Payer: Self-pay | Admitting: Internal Medicine

## 2015-12-05 DIAGNOSIS — R339 Retention of urine, unspecified: Secondary | ICD-10-CM | POA: Diagnosis not present

## 2015-12-05 DIAGNOSIS — F419 Anxiety disorder, unspecified: Secondary | ICD-10-CM | POA: Diagnosis not present

## 2015-12-05 DIAGNOSIS — Z466 Encounter for fitting and adjustment of urinary device: Secondary | ICD-10-CM | POA: Diagnosis not present

## 2015-12-05 DIAGNOSIS — M170 Bilateral primary osteoarthritis of knee: Secondary | ICD-10-CM | POA: Diagnosis not present

## 2015-12-05 DIAGNOSIS — F329 Major depressive disorder, single episode, unspecified: Secondary | ICD-10-CM | POA: Diagnosis not present

## 2015-12-05 NOTE — Telephone Encounter (Signed)
Spoke w/ Vergia Alberts, informed him that forms for wheelchair were sent to [facility] on 11/24/2015. Informed that I believe they should contact Pt directly when wheelchair is ready for pick up or delivery. Ayere verbalized understanding.

## 2015-12-05 NOTE — Telephone Encounter (Signed)
[id number]   He says that pt is suppose to receive a wheel chair. The last he spoke with CMA she was assisting with it. He is calling to follow up because he says that pt will not do anything without it .

## 2015-12-08 DIAGNOSIS — F419 Anxiety disorder, unspecified: Secondary | ICD-10-CM | POA: Diagnosis not present

## 2015-12-08 DIAGNOSIS — Z466 Encounter for fitting and adjustment of urinary device: Secondary | ICD-10-CM | POA: Diagnosis not present

## 2015-12-08 DIAGNOSIS — N401 Enlarged prostate with lower urinary tract symptoms: Secondary | ICD-10-CM | POA: Diagnosis not present

## 2015-12-08 DIAGNOSIS — I10 Essential (primary) hypertension: Secondary | ICD-10-CM | POA: Diagnosis not present

## 2015-12-08 DIAGNOSIS — N400 Enlarged prostate without lower urinary tract symptoms: Secondary | ICD-10-CM | POA: Diagnosis not present

## 2015-12-08 DIAGNOSIS — R339 Retention of urine, unspecified: Secondary | ICD-10-CM | POA: Diagnosis not present

## 2015-12-08 DIAGNOSIS — M170 Bilateral primary osteoarthritis of knee: Secondary | ICD-10-CM | POA: Diagnosis not present

## 2015-12-08 DIAGNOSIS — R338 Other retention of urine: Secondary | ICD-10-CM | POA: Diagnosis not present

## 2015-12-08 DIAGNOSIS — F329 Major depressive disorder, single episode, unspecified: Secondary | ICD-10-CM | POA: Diagnosis not present

## 2015-12-09 ENCOUNTER — Telehealth: Payer: Self-pay

## 2015-12-09 NOTE — Telephone Encounter (Signed)
Received [place] from [facility], Nurse visit from 11/19/2015 to assess foley catheter, wheelchair for household care, social worker order for community resources, and decrease of frequency for PT and OT due to insurance authorization. Forms completed and faxed to [facility]. Forms sent for scanning.

## 2015-12-10 ENCOUNTER — Telehealth: Payer: Self-pay | Admitting: Internal Medicine

## 2015-12-10 DIAGNOSIS — R339 Retention of urine, unspecified: Secondary | ICD-10-CM | POA: Diagnosis not present

## 2015-12-10 DIAGNOSIS — N390 Urinary tract infection, site not specified: Secondary | ICD-10-CM | POA: Diagnosis not present

## 2015-12-10 DIAGNOSIS — Z7982 Long term (current) use of aspirin: Secondary | ICD-10-CM | POA: Diagnosis not present

## 2015-12-10 DIAGNOSIS — B9562 Methicillin resistant Staphylococcus aureus infection as the cause of diseases classified elsewhere: Secondary | ICD-10-CM | POA: Diagnosis not present

## 2015-12-10 DIAGNOSIS — T83511A Infection and inflammatory reaction due to indwelling urethral catheter, initial encounter: Secondary | ICD-10-CM | POA: Diagnosis not present

## 2015-12-10 DIAGNOSIS — Z466 Encounter for fitting and adjustment of urinary device: Secondary | ICD-10-CM | POA: Diagnosis not present

## 2015-12-10 DIAGNOSIS — Z87891 Personal history of nicotine dependence: Secondary | ICD-10-CM | POA: Diagnosis not present

## 2015-12-10 DIAGNOSIS — T83198A Other mechanical complication of other urinary devices and implants, initial encounter: Secondary | ICD-10-CM | POA: Diagnosis not present

## 2015-12-10 DIAGNOSIS — T83498A Other mechanical complication of other prosthetic devices, implants and grafts of genital tract, initial encounter: Secondary | ICD-10-CM | POA: Diagnosis not present

## 2015-12-10 DIAGNOSIS — I10 Essential (primary) hypertension: Secondary | ICD-10-CM | POA: Diagnosis not present

## 2015-12-10 DIAGNOSIS — Z880 Allergy status to penicillin: Secondary | ICD-10-CM | POA: Diagnosis not present

## 2015-12-10 NOTE — Telephone Encounter (Signed)
My understanding is that they would do home PT/OT. I don't think he needs to go to a rehabilitation unit.

## 2015-12-10 NOTE — Telephone Encounter (Signed)
Tried calling Pt, unable to reach. Will try again later.

## 2015-12-10 NOTE — Telephone Encounter (Signed)
Caller name: Relationship to patient:Self Can be reached: [phone] Pharmacy:  Reason for call: Patient wants a call back to discuss getting help with trying to walk again. States he needs PT but does not want to go to a rehab.

## 2015-12-10 NOTE — Telephone Encounter (Signed)
Please advise? Unsure if Wayne Nash is performing PT w/ Pt.

## 2015-12-10 NOTE — Telephone Encounter (Signed)
Again tried calling Pt, unable to contact Pt.

## 2015-12-11 ENCOUNTER — Telehealth: Payer: Self-pay | Admitting: Internal Medicine

## 2015-12-11 DIAGNOSIS — R339 Retention of urine, unspecified: Secondary | ICD-10-CM | POA: Diagnosis not present

## 2015-12-11 DIAGNOSIS — M170 Bilateral primary osteoarthritis of knee: Secondary | ICD-10-CM | POA: Diagnosis not present

## 2015-12-11 DIAGNOSIS — F419 Anxiety disorder, unspecified: Secondary | ICD-10-CM | POA: Diagnosis not present

## 2015-12-11 DIAGNOSIS — F329 Major depressive disorder, single episode, unspecified: Secondary | ICD-10-CM | POA: Diagnosis not present

## 2015-12-11 DIAGNOSIS — Z466 Encounter for fitting and adjustment of urinary device: Secondary | ICD-10-CM | POA: Diagnosis not present

## 2015-12-11 NOTE — Telephone Encounter (Signed)
Okay to send a UA and urine culture

## 2015-12-11 NOTE — Telephone Encounter (Signed)
Spoke w/ Dorian Pod, verbal order given. Dorian Pod will take sample to lab tomorrow and fax results to PCP for review once received.

## 2015-12-11 NOTE — Telephone Encounter (Signed)
Please advise 

## 2015-12-11 NOTE — Telephone Encounter (Signed)
Patient went to ER and is on antibiotics

## 2015-12-11 NOTE — Telephone Encounter (Signed)
Caller name:Ellen with [facility] Relationship to patient: Can be reached:[phone] Pharmacy:  Reason for call:requesting order for UA, having UTI symptoms per family

## 2015-12-12 DIAGNOSIS — F419 Anxiety disorder, unspecified: Secondary | ICD-10-CM | POA: Diagnosis not present

## 2015-12-12 DIAGNOSIS — F329 Major depressive disorder, single episode, unspecified: Secondary | ICD-10-CM | POA: Diagnosis not present

## 2015-12-12 DIAGNOSIS — R339 Retention of urine, unspecified: Secondary | ICD-10-CM | POA: Diagnosis not present

## 2015-12-12 DIAGNOSIS — M170 Bilateral primary osteoarthritis of knee: Secondary | ICD-10-CM | POA: Diagnosis not present

## 2015-12-12 DIAGNOSIS — Z466 Encounter for fitting and adjustment of urinary device: Secondary | ICD-10-CM | POA: Diagnosis not present

## 2015-12-15 ENCOUNTER — Other Ambulatory Visit: Payer: Self-pay

## 2015-12-15 DIAGNOSIS — F419 Anxiety disorder, unspecified: Secondary | ICD-10-CM | POA: Diagnosis not present

## 2015-12-15 DIAGNOSIS — M170 Bilateral primary osteoarthritis of knee: Secondary | ICD-10-CM | POA: Diagnosis not present

## 2015-12-15 DIAGNOSIS — F329 Major depressive disorder, single episode, unspecified: Secondary | ICD-10-CM | POA: Diagnosis not present

## 2015-12-15 DIAGNOSIS — R339 Retention of urine, unspecified: Secondary | ICD-10-CM | POA: Diagnosis not present

## 2015-12-15 DIAGNOSIS — Z466 Encounter for fitting and adjustment of urinary device: Secondary | ICD-10-CM | POA: Diagnosis not present

## 2015-12-15 MED ORDER — CARVEDILOL 6.25 MG PO TABS: 6.2500 mg | ORAL_TABLET | Freq: Every day | ORAL | 0 refills | Status: DC

## 2015-12-15 MED ORDER — ESCITALOPRAM OXALATE 10 MG PO TABS
10.0000 mg | ORAL_TABLET | Freq: Every day | ORAL | 3 refills | Status: DC
Start: 2015-12-15 — End: 2016-02-11

## 2015-12-15 NOTE — Telephone Encounter (Signed)
Rx was sent to pharmacy. 

## 2015-12-16 ENCOUNTER — Telehealth: Payer: Self-pay | Admitting: Internal Medicine

## 2015-12-16 MED ORDER — HYDROCODONE-ACETAMINOPHEN 7.5-325 MG PO TABS: 1.0000 | ORAL_TABLET | Freq: Three times a day (TID) | ORAL | 0 refills | Status: DC | PRN

## 2015-12-16 NOTE — Telephone Encounter (Signed)
Rx printed, awaiting MD signature.  

## 2015-12-16 NOTE — Telephone Encounter (Signed)
Pt's granddaughter, Larene Beach, calling for refill on Hydrocodone for Pt.  Last OV: 11/17/2015 Last Fill: 12/04/2015 #45 and 0RF Pt sig: 1 tab tid prn UDS: 09/01/2015 Low risk  Please advise.

## 2015-12-16 NOTE — Telephone Encounter (Signed)
Spoke w/ Larene Beach, informed that Rx has been placed at front desk for pick up at front desk. Larene Beach verbalized understanding.

## 2015-12-16 NOTE — Telephone Encounter (Signed)
Patient's granddaughter is requesting a refill of HYDROcodone-acetaminophen (NORCO) 7.5-325 MG tablet Please advise.   Caller: Larene Beach Justice Patient Relation: Granddaughter Phone: [phone]

## 2015-12-16 NOTE — Telephone Encounter (Signed)
Okay #45, no refills (noting is 3 days early)

## 2015-12-16 NOTE — Telephone Encounter (Signed)
Spoke w/ Larene Beach, she informed she has called and spoke w/ [facility] regarding order for wheelchair and they only had [facility] Medicare card and not Pt's traditional Medicare as well. She will call back if continuing to have problems.

## 2015-12-16 NOTE — Telephone Encounter (Signed)
Laverle Hobby, patient's granddaughter, called stating that patient was told he would be getting a free wheel chair... This has still not happened. She is stating that [employer] wants a down payment and a monthly payment for the wheelchair. She is wondering what needs to be done to get medicare to pay for this? Please advise.   CallerLarene Beach Patient Relation: granddaughter Contact: [phone]

## 2015-12-18 DIAGNOSIS — Z466 Encounter for fitting and adjustment of urinary device: Secondary | ICD-10-CM | POA: Diagnosis not present

## 2015-12-18 DIAGNOSIS — M170 Bilateral primary osteoarthritis of knee: Secondary | ICD-10-CM | POA: Diagnosis not present

## 2015-12-18 DIAGNOSIS — F419 Anxiety disorder, unspecified: Secondary | ICD-10-CM | POA: Diagnosis not present

## 2015-12-18 DIAGNOSIS — R339 Retention of urine, unspecified: Secondary | ICD-10-CM | POA: Diagnosis not present

## 2015-12-18 DIAGNOSIS — F329 Major depressive disorder, single episode, unspecified: Secondary | ICD-10-CM | POA: Diagnosis not present

## 2015-12-19 DIAGNOSIS — Z466 Encounter for fitting and adjustment of urinary device: Secondary | ICD-10-CM | POA: Diagnosis not present

## 2015-12-19 DIAGNOSIS — F419 Anxiety disorder, unspecified: Secondary | ICD-10-CM | POA: Diagnosis not present

## 2015-12-19 DIAGNOSIS — F329 Major depressive disorder, single episode, unspecified: Secondary | ICD-10-CM | POA: Diagnosis not present

## 2015-12-19 DIAGNOSIS — M170 Bilateral primary osteoarthritis of knee: Secondary | ICD-10-CM | POA: Diagnosis not present

## 2015-12-19 DIAGNOSIS — R339 Retention of urine, unspecified: Secondary | ICD-10-CM | POA: Diagnosis not present

## 2015-12-20 DIAGNOSIS — Z466 Encounter for fitting and adjustment of urinary device: Secondary | ICD-10-CM | POA: Diagnosis not present

## 2015-12-20 DIAGNOSIS — F329 Major depressive disorder, single episode, unspecified: Secondary | ICD-10-CM | POA: Diagnosis not present

## 2015-12-20 DIAGNOSIS — F419 Anxiety disorder, unspecified: Secondary | ICD-10-CM | POA: Diagnosis not present

## 2015-12-20 DIAGNOSIS — R339 Retention of urine, unspecified: Secondary | ICD-10-CM | POA: Diagnosis not present

## 2015-12-20 DIAGNOSIS — M170 Bilateral primary osteoarthritis of knee: Secondary | ICD-10-CM | POA: Diagnosis not present

## 2015-12-23 NOTE — Telephone Encounter (Signed)
You mentioned regarding study that it is waiting authorization. They have not given answer yet?

## 2015-12-23 NOTE — Telephone Encounter (Signed)
Ct of chest was the order. Thanks.

## 2015-12-23 NOTE — Telephone Encounter (Signed)
CT of chest? If so patient scheduled for 02/17/16

## 2015-12-24 DIAGNOSIS — M170 Bilateral primary osteoarthritis of knee: Secondary | ICD-10-CM | POA: Diagnosis not present

## 2015-12-24 DIAGNOSIS — F329 Major depressive disorder, single episode, unspecified: Secondary | ICD-10-CM | POA: Diagnosis not present

## 2015-12-24 DIAGNOSIS — R339 Retention of urine, unspecified: Secondary | ICD-10-CM | POA: Diagnosis not present

## 2015-12-24 DIAGNOSIS — F419 Anxiety disorder, unspecified: Secondary | ICD-10-CM | POA: Diagnosis not present

## 2015-12-24 DIAGNOSIS — Z466 Encounter for fitting and adjustment of urinary device: Secondary | ICD-10-CM | POA: Diagnosis not present

## 2015-12-25 DIAGNOSIS — M170 Bilateral primary osteoarthritis of knee: Secondary | ICD-10-CM | POA: Diagnosis not present

## 2015-12-25 DIAGNOSIS — R339 Retention of urine, unspecified: Secondary | ICD-10-CM | POA: Diagnosis not present

## 2015-12-25 DIAGNOSIS — F419 Anxiety disorder, unspecified: Secondary | ICD-10-CM | POA: Diagnosis not present

## 2015-12-25 DIAGNOSIS — F329 Major depressive disorder, single episode, unspecified: Secondary | ICD-10-CM | POA: Diagnosis not present

## 2015-12-25 DIAGNOSIS — Z466 Encounter for fitting and adjustment of urinary device: Secondary | ICD-10-CM | POA: Diagnosis not present

## 2015-12-29 ENCOUNTER — Other Ambulatory Visit: Payer: Self-pay | Admitting: Internal Medicine

## 2015-12-29 DIAGNOSIS — F329 Major depressive disorder, single episode, unspecified: Secondary | ICD-10-CM | POA: Diagnosis not present

## 2015-12-29 DIAGNOSIS — F419 Anxiety disorder, unspecified: Secondary | ICD-10-CM | POA: Diagnosis not present

## 2015-12-29 DIAGNOSIS — R339 Retention of urine, unspecified: Secondary | ICD-10-CM | POA: Diagnosis not present

## 2015-12-29 DIAGNOSIS — Z466 Encounter for fitting and adjustment of urinary device: Secondary | ICD-10-CM | POA: Diagnosis not present

## 2015-12-29 DIAGNOSIS — M170 Bilateral primary osteoarthritis of knee: Secondary | ICD-10-CM | POA: Diagnosis not present

## 2015-12-29 NOTE — Telephone Encounter (Signed)
Rx printed, awaiting MD signature.  

## 2015-12-29 NOTE — Telephone Encounter (Signed)
Pt is requesting refill on Lorazepam.  Last OV: 11/17/2015 Last Fill: 10/30/2015 #60 and 0RF UDS: 09/01/2015 Low risk  Please advise.

## 2015-12-29 NOTE — Telephone Encounter (Signed)
Okay #60, no refills 

## 2015-12-29 NOTE — Telephone Encounter (Signed)
[facility] [place], [place].

## 2015-12-31 ENCOUNTER — Telehealth: Payer: Self-pay | Admitting: Internal Medicine

## 2015-12-31 DIAGNOSIS — F419 Anxiety disorder, unspecified: Secondary | ICD-10-CM | POA: Diagnosis not present

## 2015-12-31 DIAGNOSIS — M170 Bilateral primary osteoarthritis of knee: Secondary | ICD-10-CM | POA: Diagnosis not present

## 2015-12-31 DIAGNOSIS — R339 Retention of urine, unspecified: Secondary | ICD-10-CM | POA: Diagnosis not present

## 2015-12-31 DIAGNOSIS — Z466 Encounter for fitting and adjustment of urinary device: Secondary | ICD-10-CM | POA: Diagnosis not present

## 2015-12-31 DIAGNOSIS — F329 Major depressive disorder, single episode, unspecified: Secondary | ICD-10-CM | POA: Diagnosis not present

## 2015-12-31 MED ORDER — HYDROCODONE-ACETAMINOPHEN 7.5-325 MG PO TABS: 1.0000 | ORAL_TABLET | Freq: Three times a day (TID) | ORAL | 0 refills | Status: DC | PRN

## 2015-12-31 NOTE — Telephone Encounter (Signed)
(  Prescribed #45 12/16/2015, 3 days earlier, he had enough for 18 days thus is due for a prescription ~today)  Advise patient, he is to take the medication only as needed, only 3 times a day. Also reminded him not to let anybody else take his medication. #45, no RF

## 2015-12-31 NOTE — Telephone Encounter (Signed)
Patient is calling requesting a refill of HYDROcodone-acetaminophen (NORCO) 7.5-325 MG tablet. Patient seems unsure as to whether he is taking them every 6 hours or every 4 hours... He states he is out and would like to get some more because he is getting his catheter taken out and will need something for the pain. He would like a call either way, please advise.    Patient phone: [phone]

## 2015-12-31 NOTE — Telephone Encounter (Signed)
LMOM informing Pt that Rx has been placed at front desk for pick up and informed of PCP recommendations below. Instructed Pt to call if questions/concerns.

## 2015-12-31 NOTE — Telephone Encounter (Signed)
Rx printed, awaiting MD signature.  

## 2015-12-31 NOTE — Telephone Encounter (Signed)
Please advise, Rx'ed last on 12/16/2015.

## 2016-01-01 ENCOUNTER — Telehealth: Payer: Self-pay | Admitting: Internal Medicine

## 2016-01-01 DIAGNOSIS — N401 Enlarged prostate with lower urinary tract symptoms: Secondary | ICD-10-CM | POA: Diagnosis not present

## 2016-01-01 DIAGNOSIS — R338 Other retention of urine: Secondary | ICD-10-CM | POA: Diagnosis not present

## 2016-01-01 DIAGNOSIS — I10 Essential (primary) hypertension: Secondary | ICD-10-CM | POA: Diagnosis not present

## 2016-01-01 NOTE — Telephone Encounter (Signed)
°  Relation to [id number] Call back number:  [phone]    Reason for call:  Patient states urine catheter was taken out and put back in therefore he's requesting orders for home health aid assistance, please advise

## 2016-01-01 NOTE — Telephone Encounter (Signed)
Patient's granddaughter called and is aware that rx is at front.

## 2016-01-02 DIAGNOSIS — F419 Anxiety disorder, unspecified: Secondary | ICD-10-CM | POA: Diagnosis not present

## 2016-01-02 DIAGNOSIS — F329 Major depressive disorder, single episode, unspecified: Secondary | ICD-10-CM | POA: Diagnosis not present

## 2016-01-02 DIAGNOSIS — Z466 Encounter for fitting and adjustment of urinary device: Secondary | ICD-10-CM | POA: Diagnosis not present

## 2016-01-02 DIAGNOSIS — R339 Retention of urine, unspecified: Secondary | ICD-10-CM | POA: Diagnosis not present

## 2016-01-02 DIAGNOSIS — M170 Bilateral primary osteoarthritis of knee: Secondary | ICD-10-CM | POA: Diagnosis not present

## 2016-01-02 NOTE — Telephone Encounter (Signed)
Will look out for new [place] of care from [facility].

## 2016-01-03 DIAGNOSIS — Z880 Allergy status to penicillin: Secondary | ICD-10-CM | POA: Diagnosis not present

## 2016-01-03 DIAGNOSIS — Z87891 Personal history of nicotine dependence: Secondary | ICD-10-CM | POA: Diagnosis not present

## 2016-01-03 DIAGNOSIS — R319 Hematuria, unspecified: Secondary | ICD-10-CM | POA: Diagnosis not present

## 2016-01-03 DIAGNOSIS — R350 Frequency of micturition: Secondary | ICD-10-CM | POA: Diagnosis not present

## 2016-01-03 DIAGNOSIS — I10 Essential (primary) hypertension: Secondary | ICD-10-CM | POA: Diagnosis not present

## 2016-01-03 DIAGNOSIS — Z79899 Other long term (current) drug therapy: Secondary | ICD-10-CM | POA: Diagnosis not present

## 2016-01-03 DIAGNOSIS — Z7982 Long term (current) use of aspirin: Secondary | ICD-10-CM | POA: Diagnosis not present

## 2016-01-06 DIAGNOSIS — F329 Major depressive disorder, single episode, unspecified: Secondary | ICD-10-CM | POA: Diagnosis not present

## 2016-01-06 DIAGNOSIS — Z466 Encounter for fitting and adjustment of urinary device: Secondary | ICD-10-CM | POA: Diagnosis not present

## 2016-01-06 DIAGNOSIS — F419 Anxiety disorder, unspecified: Secondary | ICD-10-CM | POA: Diagnosis not present

## 2016-01-06 DIAGNOSIS — M170 Bilateral primary osteoarthritis of knee: Secondary | ICD-10-CM | POA: Diagnosis not present

## 2016-01-06 DIAGNOSIS — R339 Retention of urine, unspecified: Secondary | ICD-10-CM | POA: Diagnosis not present

## 2016-01-06 NOTE — Telephone Encounter (Signed)
Patient states that he has never seen a hydrocodone pill that looks like the pill that his granddaughter brought him. He states he is concerned about his granddaughter.... he states it is a yellow pill with a mark down the center and has 340 on one side.  He would like a call back regarding this situation.  Please advise.   Patient phone: [phone]

## 2016-01-06 NOTE — Telephone Encounter (Addendum)
Spoke w/ Shanon Brow at [employer], informed him of Pt call and concerns. He informed me that he had actually spoken with the Pt's son, Richardson Landry, about an hour ago via telephone about the same regards. David recommended he bring medication and bottle to pharmacy, which Richardson Landry did about 10-15 minutes ago. Shanon Brow confirmed that the tablets were in fact NOT Hydrocodone but Naproxen 250mg  tabs. [facility] computer system currently down and was unable to see when last Hydrocodone prescription was dropped off and filled by them and unable to tell me who typically picks up the prescription from the pharmacy (due to computer system down). However, [facility] was in the process of checking their Naproxen and Hydrocodone inventories to make sure that the wrong medication was not given (informed so far that the inventory was direct/no discrepancies). Informed Shanon Brow that I would inform PCP. Shanon Brow informed that he does recommend that the authorities be informed of the missing #45 Hydrocodone tablets.

## 2016-01-07 ENCOUNTER — Telehealth: Payer: Self-pay

## 2016-01-07 NOTE — Telephone Encounter (Signed)
Patient called to report that he has yet to receive his Hydrocodone. States pharmacy gave him wrong medication and he really needs his pain medication. Advised I would check with Dr. Ethel Rana nurse to check status.of follow up.

## 2016-01-07 NOTE — Telephone Encounter (Signed)
Called [facility] to confirm that they completed inventory verification. They are aware of the situation and asked me to call back this afternoon to speak with Lyanne Co, Chartered certified accountant, as he is currently not available. Will return call to [facility] this afternoon and contact pt and APS once we have verified that this was not a pharmacy error.

## 2016-01-07 NOTE — Telephone Encounter (Addendum)
Spoke w/ Lyanne Co at [employer]. He did verify inventory, and pt was given correct medication from the pharmacy. The naproxen tablets that pt's meds were replaced with do not even match the naproxen tablets that the pharmacy stocks. Hydrocodone rx was picked up on 01/01/16 by someone w/ last name "Justice," driver license ID# [id number].   Called pt at both numbers listed in chart and CB# listed in notes below. No answer at any number. Left message for pt to return call.   [facility] (pt lives in [place]) at [phone] and spoke w/ Ms. Mel Almond. Provided pt demographics and reported concerns that pt's medications were being stolen/diverted by family member. She stated that the person who takes these calls was on another call and would return call to the office. I will call back tomorrow if I have not heard back.

## 2016-01-07 NOTE — Telephone Encounter (Addendum)
Pt returned call, notified him of PCP instructions. He was very upset that he cannot having his hydrocodone stating "the only person who gets hurt in this process is me" and "I'm too old to live in pain. I can't live without my hydrocodone." States that Tylenol alone is not effective for his pain. Currently his pain is 5/10, baseline is  3-4/10. Pain is localized to groin area due to catheter. He is able to drive and his granddaughter has only been picking up rxs for convenience. Pt would like to know if he can have a refill of hydrocodone if he picks it up in office and takes it to the pharmacy himself. Please advise.

## 2016-01-07 NOTE — Telephone Encounter (Signed)
Please see telephone note dated 12/31/2015 for further details per PCP no further narcotics will be given.

## 2016-01-07 NOTE — Telephone Encounter (Signed)
Suspect the granddaughter is diverting hydrocodone on her favor. No further prescriptions of narcotics from me since they are not reaching the patient. Advise pt  not take the pills ( naproxen)  that was brought to him by the granddaughter. Okay to take Tylenol as needed Please call Adult Protective Services and notify them of patient's situation

## 2016-01-08 MED ORDER — HYDROCODONE-ACETAMINOPHEN 7.5-325 MG PO TABS: 1.0000 | ORAL_TABLET | Freq: Two times a day (BID) | ORAL | 0 refills | Status: DC | PRN

## 2016-01-08 MED FILL — HYDROCODON-APAP 7.5-325: 7.5-325 | 10 days supply | Qty: 20 | Fill #0

## 2016-01-08 NOTE — Telephone Encounter (Signed)
Patient would like to speak with nurse directly regarding medication below, please advise.

## 2016-01-08 NOTE — Addendum Note (Signed)
Addended by: Kathlene November E on: 01/08/2016 10:28 AM   Modules accepted: Orders

## 2016-01-08 NOTE — Telephone Encounter (Signed)
Again please see open telephone note from 12/31/2015 regarding this issue. Awaiting PCP response.

## 2016-01-08 NOTE — Telephone Encounter (Signed)
Wayne Nash from [facility] returned call. Discussed pt's situation with her and she recommended that someone (either [place] or the pt) contact local law enforcement as this is a criminal offense and there is nothing that APS can do at this point. They do however have an "elder task force" where they get together w/ law enforcement and discuss cases such as this, but these cases are initiated by law enforcement. Please advise if you would like me to contact [facility] Department.

## 2016-01-08 NOTE — Telephone Encounter (Signed)
Called Adult Protective Services at [phone] and left message to return call as I have not heard back from report/referral that was started yesterday.

## 2016-01-08 NOTE — Telephone Encounter (Signed)
Pt notified of instructions and verbalized understanding. He was very Patent attorney of PCP working with him and wanted me to assure Dr. Larose Kells that he is not trying to do anything wrong, he is just trying to do what he needs to do to stay out of pain. I assured pt that neither I nor Dr. Larose Kells is accusing him of doing anything wrong, but rather this is just an unfortunate situation where he has been taken advantage of.  Pharmacy notified of instructions and verbalized understanding. I will keep printed rx at my desk until pt arrives in office to ensure it is not given to anyone besides the pt.

## 2016-01-08 NOTE — Telephone Encounter (Addendum)
I understand his frustration, unfortunately is apparent that he has been taking advantage of. He is living without hydrocodone because the medication I am trying to prescribe to him has been diverted. Recommend to manage pain with Tylenol as needed Will do a limited amount of hydrocodone, he has to pick up the Rx and medication himself. Will limit the amount to one tablet twice a day, will prescribe 10 days at the time. When he needs a refill, he has to call, not any other person. Please notify the patient and the pharmacist off above.

## 2016-01-09 NOTE — Telephone Encounter (Signed)
Please contact police due to suspected control substance diversion in the context of elderly abuse

## 2016-01-09 NOTE — Telephone Encounter (Signed)
[employer] at [id number]. Spoke w/ Doren Custard and gave pt's address. He said they do not normally take reports by phone, but that he will have an officer call me back with more info.

## 2016-01-09 NOTE — Telephone Encounter (Addendum)
Full report of situation outlined below given to Officer [employer]. Report # [id number].

## 2016-01-14 ENCOUNTER — Telehealth: Payer: Self-pay | Admitting: Internal Medicine

## 2016-01-14 DIAGNOSIS — F419 Anxiety disorder, unspecified: Secondary | ICD-10-CM | POA: Diagnosis not present

## 2016-01-14 DIAGNOSIS — R339 Retention of urine, unspecified: Secondary | ICD-10-CM | POA: Diagnosis not present

## 2016-01-14 DIAGNOSIS — Z466 Encounter for fitting and adjustment of urinary device: Secondary | ICD-10-CM | POA: Diagnosis not present

## 2016-01-14 DIAGNOSIS — M170 Bilateral primary osteoarthritis of knee: Secondary | ICD-10-CM | POA: Diagnosis not present

## 2016-01-14 DIAGNOSIS — F329 Major depressive disorder, single episode, unspecified: Secondary | ICD-10-CM | POA: Diagnosis not present

## 2016-01-14 NOTE — Telephone Encounter (Signed)
Okay to continue PT if the patient and his therapist believes he still needs it

## 2016-01-14 NOTE — Telephone Encounter (Signed)
Patient called to follow up on this situation. He understands though that Dr. Larose Kells is busy today but he just wanted to follow up to make sure the situation was being worked on. I informed him it was.

## 2016-01-14 NOTE — Telephone Encounter (Signed)
Relation to [id number] Call back number: [phone]   Reason for call:  Patient called and states physical therapy is being discontinued and he doesn't understand why, please advise

## 2016-01-14 NOTE — Telephone Encounter (Signed)
Spoke w/ Pt, he informed that [facility] informed him that he is at the end of his PT certification and they won't be able to come out to his home for more visits until PCP "okays" the order. He is requesting more physical therapy as he does feel like it is helping him gain more strength. Informed Pt I would let PCP know to see if okay to place further home health PT. Pt verbalized understanding and is aware that the office is closed tomorrow and Friday for Thanksgiving holiday.

## 2016-01-14 NOTE — Telephone Encounter (Signed)
Received fax from [facility] regarding PT. Wayne Nash has requested further PT visits, however, they are awaiting insurance approval. Orders signed and faxed to [phone]. Form sent for scanning.

## 2016-01-20 ENCOUNTER — Telehealth: Payer: Self-pay | Admitting: Internal Medicine

## 2016-01-20 MED ORDER — HYDROCODONE-ACETAMINOPHEN 7.5-325 MG PO TABS: 1.0000 | ORAL_TABLET | Freq: Two times a day (BID) | ORAL | 0 refills | Status: DC | PRN

## 2016-01-20 NOTE — Telephone Encounter (Signed)
Patient requesting a refill for Hydrocodone he has one left uses pharmacy down stair,   Patient needs a script for home heath renewed as well. [employer] Lemmings  Is the home health company [phone]

## 2016-01-20 NOTE — Telephone Encounter (Signed)
Spoke w/ Pt, informed him that Rx has been placed at front desk for pick up. Informed Pt that he must be the one to pick up prescription and take to pharmacy. Also informed him that Alvis Lemmings is waiting for insurance approval for further home health aide. Pt verbalized understanding.

## 2016-01-20 NOTE — Telephone Encounter (Signed)
Please advise regarding Hydrocodone refill. Notes were received on 01/14/2016 for PT; they are awaiting insurance approval for further PT visits. Nothing further can be done until they receive insurance determination.

## 2016-01-20 NOTE — Telephone Encounter (Signed)
Rx printed, awaiting MD signature.  

## 2016-01-20 NOTE — Telephone Encounter (Signed)
Okay to refill #20. Same sig. Needs to pick up and drop Rx personally

## 2016-01-21 ENCOUNTER — Other Ambulatory Visit: Payer: Self-pay | Admitting: Internal Medicine

## 2016-01-21 MED FILL — HYDROCODON-APAP 7.5-325: 7.5-325 | 10 days supply | Qty: 20 | Fill #0

## 2016-01-21 NOTE — Telephone Encounter (Signed)
Okay lorazepam, please put in the prescription the same precautions as in pain medication: medications only to be picked up by the patient. Take doxepin for 3 months

## 2016-01-21 NOTE — Telephone Encounter (Signed)
Pt is requesting refill on Lorazepam and Doxepin.  Last OV: 11/17/2015 Last Fill on Lorazepam: 12/29/2015 #60 and 0RF Pt sig: 1 tab q4h prn Last Fill: 02/14/2015 #30 and 5RF UDS: 09/01/2015 Low risk  Please advise.

## 2016-01-22 NOTE — Telephone Encounter (Signed)
Rx's printed, awaiting MD signature.  

## 2016-01-22 NOTE — Telephone Encounter (Signed)
[facility] [place].

## 2016-01-23 ENCOUNTER — Telehealth: Payer: Self-pay

## 2016-01-23 NOTE — Telephone Encounter (Signed)
Received ROI for Verification Medical Related Milage from [facility]. They are requesting last 12 months of OV notes for verification of appointments. OV notes from 03/24/2015, 04/10/2015, 09/18/2015, 11/17/2015 printed and faxed to [phone].

## 2016-01-23 NOTE — Telephone Encounter (Signed)
Received fax confirmation on 01/23/2016 at 2:23 PM. ROI sent for scanning.

## 2016-01-28 ENCOUNTER — Telehealth: Payer: Self-pay | Admitting: Internal Medicine

## 2016-01-28 MED ORDER — HYDROCODONE-ACETAMINOPHEN 7.5-325 MG PO TABS: 1.0000 | ORAL_TABLET | Freq: Two times a day (BID) | ORAL | 0 refills | Status: DC | PRN

## 2016-01-28 NOTE — Telephone Encounter (Signed)
Patient made aware. Patient will be in to pick it up tomorrow.

## 2016-01-28 NOTE — Telephone Encounter (Signed)
Patient called requesting a refill of HYDROcodone-acetaminophen (NORCO) 7.5-325 MG tablet. He states he has 2 pills left. He requested that I put in the note to let Dr. Larose Kells know that he is not a drug addict he just needs his medication. Please advise.    Patient phone:  [phone]

## 2016-01-28 NOTE — Telephone Encounter (Signed)
Okay #20, no refills. Patient to pick up prescription and  medications from the drugstore.

## 2016-01-28 NOTE — Telephone Encounter (Signed)
Rx printed, awaiting MD signature.  

## 2016-01-28 NOTE — Telephone Encounter (Signed)
Please inform Pt that Rx has been placed at front desk for pick up. Thank you.  

## 2016-01-29 DIAGNOSIS — Z9289 Personal history of other medical treatment: Secondary | ICD-10-CM | POA: Diagnosis not present

## 2016-01-29 DIAGNOSIS — N400 Enlarged prostate without lower urinary tract symptoms: Secondary | ICD-10-CM | POA: Diagnosis not present

## 2016-01-29 DIAGNOSIS — I10 Essential (primary) hypertension: Secondary | ICD-10-CM | POA: Diagnosis not present

## 2016-01-29 DIAGNOSIS — R338 Other retention of urine: Secondary | ICD-10-CM | POA: Diagnosis not present

## 2016-01-29 DIAGNOSIS — N401 Enlarged prostate with lower urinary tract symptoms: Secondary | ICD-10-CM | POA: Diagnosis not present

## 2016-01-29 MED FILL — HYDROCODON-APAP 7.5-325: 7.5-325 | 10 days supply | Qty: 20 | Fill #0

## 2016-02-02 DIAGNOSIS — R338 Other retention of urine: Secondary | ICD-10-CM | POA: Diagnosis not present

## 2016-02-03 DIAGNOSIS — R1030 Lower abdominal pain, unspecified: Secondary | ICD-10-CM | POA: Diagnosis not present

## 2016-02-03 DIAGNOSIS — N4889 Other specified disorders of penis: Secondary | ICD-10-CM | POA: Diagnosis not present

## 2016-02-03 DIAGNOSIS — Z7982 Long term (current) use of aspirin: Secondary | ICD-10-CM | POA: Diagnosis not present

## 2016-02-03 DIAGNOSIS — Z79899 Other long term (current) drug therapy: Secondary | ICD-10-CM | POA: Diagnosis not present

## 2016-02-03 DIAGNOSIS — I10 Essential (primary) hypertension: Secondary | ICD-10-CM | POA: Diagnosis not present

## 2016-02-03 DIAGNOSIS — T83498A Other mechanical complication of other prosthetic devices, implants and grafts of genital tract, initial encounter: Secondary | ICD-10-CM | POA: Diagnosis not present

## 2016-02-03 DIAGNOSIS — Z880 Allergy status to penicillin: Secondary | ICD-10-CM | POA: Diagnosis not present

## 2016-02-03 DIAGNOSIS — T83198A Other mechanical complication of other urinary devices and implants, initial encounter: Secondary | ICD-10-CM | POA: Diagnosis not present

## 2016-02-03 DIAGNOSIS — Z87891 Personal history of nicotine dependence: Secondary | ICD-10-CM | POA: Diagnosis not present

## 2016-02-03 DIAGNOSIS — N400 Enlarged prostate without lower urinary tract symptoms: Secondary | ICD-10-CM | POA: Diagnosis not present

## 2016-02-03 DIAGNOSIS — R339 Retention of urine, unspecified: Secondary | ICD-10-CM | POA: Diagnosis not present

## 2016-02-04 DIAGNOSIS — T83091A Other mechanical complication of indwelling urethral catheter, initial encounter: Secondary | ICD-10-CM | POA: Diagnosis not present

## 2016-02-04 DIAGNOSIS — T83098A Other mechanical complication of other indwelling urethral catheter, initial encounter: Secondary | ICD-10-CM | POA: Diagnosis not present

## 2016-02-04 DIAGNOSIS — Z7982 Long term (current) use of aspirin: Secondary | ICD-10-CM | POA: Diagnosis not present

## 2016-02-04 DIAGNOSIS — I10 Essential (primary) hypertension: Secondary | ICD-10-CM | POA: Diagnosis not present

## 2016-02-04 DIAGNOSIS — Z87891 Personal history of nicotine dependence: Secondary | ICD-10-CM | POA: Diagnosis not present

## 2016-02-04 DIAGNOSIS — R531 Weakness: Secondary | ICD-10-CM | POA: Diagnosis not present

## 2016-02-04 DIAGNOSIS — Z79899 Other long term (current) drug therapy: Secondary | ICD-10-CM | POA: Diagnosis not present

## 2016-02-04 DIAGNOSIS — Z880 Allergy status to penicillin: Secondary | ICD-10-CM | POA: Diagnosis not present

## 2016-02-05 ENCOUNTER — Telehealth: Payer: Self-pay | Admitting: Internal Medicine

## 2016-02-05 ENCOUNTER — Other Ambulatory Visit: Payer: Self-pay | Admitting: Internal Medicine

## 2016-02-05 DIAGNOSIS — R339 Retention of urine, unspecified: Secondary | ICD-10-CM

## 2016-02-05 MED ORDER — HYDROCODONE-ACETAMINOPHEN 7.5-325 MG PO TABS: 1.0000 | ORAL_TABLET | Freq: Two times a day (BID) | ORAL | 0 refills | Status: DC | PRN

## 2016-02-05 NOTE — Telephone Encounter (Signed)
Home health referral placed. Rx for Hydrocodone printed, awaiting MD signature.

## 2016-02-05 NOTE — Telephone Encounter (Signed)
Please inform Pt that Rx has been placed at front desk for pick up. (Rx can only be picked up by Pt himself and taken to pharmacy himself). Thank you.

## 2016-02-05 NOTE — Telephone Encounter (Signed)
Patient informed. 

## 2016-02-05 NOTE — Telephone Encounter (Signed)
Please advise 

## 2016-02-05 NOTE — Telephone Encounter (Signed)
Okay to put a home health agency referral noting that the patient has refused their services before. Okay to paint a prescription for hydrocodone, same precautions as before.

## 2016-02-05 NOTE — Telephone Encounter (Signed)
Called patient who states he has had problems with both legs swelling and getting hard and larger. Patient denies shortness of breath,chest pain. States he has had problems with catheter and has spoke with provider regarding this. Appointment scheduled for 02/11/16. Advised patient if he developed shortness of breath, or chest pain, he needed to go directly to ED. Patient agreed

## 2016-02-05 NOTE — Telephone Encounter (Signed)
Relation to [id number] Call back number:[phone]   Reason for call:   Patient was seen in the ED due to urine catheter, patient requesting orders for home health aid assistance, patient stating he needs assistance with daily living, please advise  Patient requesting a refill HYDROcodone-acetaminophen ([place]) 7.5-325 MG tablet patient states will run out by Monday, please advise

## 2016-02-05 NOTE — Telephone Encounter (Signed)
Can you triage Pt? 

## 2016-02-05 NOTE — Telephone Encounter (Signed)
Caller name: Relationship to patient: Self Can be reached: [phone]  Pharmacy:  Reason for call: Patient request call back to discuss swelling in legs

## 2016-02-06 ENCOUNTER — Telehealth: Payer: Self-pay | Admitting: Internal Medicine

## 2016-02-06 NOTE — Telephone Encounter (Signed)
Caryl Pina with [facility] called stating that they received the referral for the patient and they will be out to see him tomorrow because they just received the referral today. Mel Almond phone: [phone]

## 2016-02-06 NOTE — Telephone Encounter (Signed)
noted 

## 2016-02-06 NOTE — Telephone Encounter (Signed)
Noted  

## 2016-02-08 ENCOUNTER — Other Ambulatory Visit: Payer: Self-pay | Admitting: Internal Medicine

## 2016-02-09 ENCOUNTER — Telehealth: Payer: Self-pay | Admitting: Internal Medicine

## 2016-02-09 MED FILL — HYDROCODON-APAP 7.5-325: 7.5-325 | 10 days supply | Qty: 20 | Fill #0

## 2016-02-09 NOTE — Telephone Encounter (Signed)
Patient's son made aware. He stated that he would do his best to get him there but something would have to change in the future. I let him know that as of now, this is what was instructed.

## 2016-02-09 NOTE — Telephone Encounter (Signed)
Apparently taking lorazepam frequently. Change sig to  3 times a day prn  Okay #60 no refills Same precautions: Patient is to pick up the medication himself.

## 2016-02-09 NOTE — Telephone Encounter (Signed)
Hydrocodone must be picked up at office due to being a codeine product and picked up by Pt.

## 2016-02-09 NOTE — Telephone Encounter (Signed)
[facility]  

## 2016-02-09 NOTE — Telephone Encounter (Signed)
Rx printed, awaiting MD signature.  

## 2016-02-09 NOTE — Telephone Encounter (Signed)
Patient's son Raymundo Pinales called asking if he really needs to pick up his father and bring him to our office to pick up his medication. After checking the current DPR I do not see that Annie Main is on it. Please advise.   Annie Main phone: [phone]

## 2016-02-09 NOTE — Telephone Encounter (Signed)
Pt is requesting refill on Lorazepam.  Last OV: 11/17/2015 Last Fill: 01/22/2016 #60 and 0RF Pt sig: 1 tab q4h PRN UDS: 09/01/2015 Low risk  Please advise.

## 2016-02-09 NOTE — Telephone Encounter (Signed)
Just an FYI

## 2016-02-11 ENCOUNTER — Ambulatory Visit (INDEPENDENT_AMBULATORY_CARE_PROVIDER_SITE_OTHER): Payer: Commercial Managed Care - HMO | Admitting: Internal Medicine

## 2016-02-11 ENCOUNTER — Telehealth: Payer: Self-pay | Admitting: Internal Medicine

## 2016-02-11 VITALS — BP 123/53 | HR 60 | Temp 97.8°F | Ht 61.0 in | Wt 215.0 lb

## 2016-02-11 DIAGNOSIS — R339 Retention of urine, unspecified: Secondary | ICD-10-CM

## 2016-02-11 DIAGNOSIS — F329 Major depressive disorder, single episode, unspecified: Secondary | ICD-10-CM

## 2016-02-11 DIAGNOSIS — G8929 Other chronic pain: Secondary | ICD-10-CM

## 2016-02-11 DIAGNOSIS — G4700 Insomnia, unspecified: Secondary | ICD-10-CM

## 2016-02-11 DIAGNOSIS — F419 Anxiety disorder, unspecified: Principal | ICD-10-CM

## 2016-02-11 DIAGNOSIS — F418 Other specified anxiety disorders: Secondary | ICD-10-CM | POA: Diagnosis not present

## 2016-02-11 DIAGNOSIS — M545 Low back pain: Secondary | ICD-10-CM | POA: Diagnosis not present

## 2016-02-11 MED ORDER — ESCITALOPRAM OXALATE 20 MG PO TABS: 20.0000 mg | ORAL_TABLET | Freq: Every day | ORAL | 5 refills | Status: DC

## 2016-02-11 NOTE — Progress Notes (Signed)
Pre visit review using our clinic review tool, if applicable. No additional management support is needed unless otherwise documented below in the visit note. 

## 2016-02-11 NOTE — Telephone Encounter (Signed)
Orders related to the catheter need to come from urology. Okay to fax med list-H&P

## 2016-02-11 NOTE — Telephone Encounter (Signed)
LMOM informing Wayne Nash, that Pt was seen today and adjustments were made on his medications, waiting for PCP to finish OV note and will fax when completed to her. Also informed her Pt is interested in speaking with someone in regards to possibly moving into assisted living, he is unsure if he can afford it but would like to discuss with agency. Instructed Wayne Nash to return call at her convenience.

## 2016-02-11 NOTE — Progress Notes (Signed)
Subjective:    Patient ID: Wayne Nash, male    DOB: 08/30/1933, 82 y.o.   MRN: [id number]  DOS:  02/11/2016 Type of visit - description :  Acute visit Interval history: The first a statement he made when I step in the room is that he is scared . States that his mobility is very limited, he lives by himself, family does not visit him frequently. Admits to depression, denies suicidal ideas. On Lexapro and Ativan.  Also, lower extremity edema unchanged.  Continue with problems related to the  bladder catheter.  He is somewhat upset about me not prescribing hydrocodone and Ativan in larger quantities.   Review of Systems  Denies chest pain or difficulty breathing No nausea vomiting No cough No suicidal ideas No fever chills  Past Medical History:  Diagnosis Date  . H/O urinary retention   . Hyperlipidemia   . Hypertension   . Primary osteoarthritis of left knee   . Primary osteoarthritis of right knee     Past Surgical History:  Procedure Laterality Date  . APPENDECTOMY    . LUMBAR DISC SURGERY     Dr Shellia Carwin  . SKIN BIOPSY      Social History   Social History  . Marital status: Divorced    Spouse name: N/A  . Number of children: 3  . Years of education: N/A   Occupational History  . 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 Smoking status: Former Smoker    Quit date: 02/22/1994  . Smokeless tobacco: Never Used     Comment: smoked 1953-1996, up to 1.5 ppd  . Alcohol use No     Comment:  quit 1985  . Drug use: No  . Sexual activity: Not on file   Other Topics Concern  . Not on file   Social History Narrative   3 children, all live within 20 miles   7 Gc, 3 GGchildren   As off 11-17-15:   lives by himself,   G daughter  Laverle Hobby used to help him more  [phone]       Allergies as of 02/11/2016      Reactions   Penicillins    REACTION: SWELLING/HIVES      Medication List         Accurate as of 02/11/16 11:59 PM. Always use your most recent med list.          aspirin 81 MG tablet Take 81 mg by mouth daily.   atorvastatin 20 MG tablet Commonly known as:  LIPITOR Take 1 tablet (20 mg total) by mouth daily.   carvedilol 6.25 MG tablet Commonly known as:  COREG Take 1 tablet (6.25 mg total) by mouth daily.   ciprofloxacin 500 MG tablet Commonly known as:  CIPRO Take 500 mg by mouth 2 (two) times daily.   cyclobenzaprine 5 MG tablet Commonly known as:  FLEXERIL Take 1 tablet (5 mg total) by mouth at bedtime as needed for muscle spasms.   doxepin 10 MG capsule Commonly known as:  SINEQUAN Take 1 capsule (10 mg total) by mouth at bedtime as needed.   escitalopram 20 MG tablet Commonly known as:  LEXAPRO Take 1 tablet (20 mg total) by mouth daily.   furosemide 20 MG tablet Commonly known as:  LASIX Take 2 tablets (40 mg total) by mouth daily.   gabapentin 300 MG capsule Commonly known as:  NEURONTIN  Take 1 capsule (300 mg total) by mouth 2 (two) times daily.   HYDROcodone-acetaminophen 7.5-325 MG tablet Commonly known as:  NORCO Take 1 tablet by mouth 2 (two) times daily as needed for moderate pain.   LORazepam 0.5 MG tablet Commonly known as:  ATIVAN Take 1 tablet (0.5 mg total) by mouth 3 (three) times daily as needed.   potassium chloride 10 MEQ tablet Commonly known as:  K-DUR Take 1 tablet (10 mEq total) by mouth daily.   tamsulosin 0.4 MG Caps capsule Commonly known as:  FLOMAX Take 1 capsule (0.4 mg total) by mouth daily.   tiotropium 18 MCG inhalation capsule Commonly known as:  SPIRIVA Place 1 capsule (18 mcg total) into inhaler and inhale daily.          Objective:   Physical Exam BP (!) 123/53 (BP Location: Left Arm, Patient Position: Sitting, Cuff Size: Large)   Pulse 60   Temp 97.8 F (36.6 C) (Oral)   Ht 5\' 1"  (1.549 m)   Wt 215 lb (97.5 kg)   SpO2 94% Comment: RA  BMI 40.62 kg/m 5 PPD BASIC      General:   Well developed, well nourished, sitting in a wheelchair, in no distress.  HEENT:  Normocephalic . Face symmetric, atraumatic Lower extremities: + Pitting edema, mild. Left calf is larger by around 2 cm in circumference. At baseline. Not tender. Skin: Not pale. Not jaundice Neurologic:  alert & oriented X3.  Speech normal, gait not tested Psych--  Cognition and judgment appear intact.  Cooperative with normal attention span and concentration.  Behavior appropriate. Not tearful, not crying but he seems upset.  Assessment & Plan:   Assessment  Prediabetes  HTN EDEMA, LE, L>R chronic (echo 09-2013 ok, [place] 03-2014 no DVT) Hyperlipidemia Anxiety, insomnia --prn doxepin,ativan B12 deficiency COPD (dx 2016: former smoker, DOE, PFTs rx but not done as off 03-2015) MSK --Gait d/o likely multifactorial --Chronic back pain--- hydrocodone (UDS), started gabapentin 12-2014 --DJD Rash (persistent throughout 2015--scabies) Pulmonary Nodule, last CT  09-2014 recheck 1 year  H/o colelithiasis Urinary retention: Onset 11/07/2015, urology Dr. Burt Knack. Family contact  [place] [phone]     PLAN Labs from care every where reviewed  Anxiety, now with depression and insomnia. Recently has developed depression, he states that he feels lonely, reports his family hardly ever visit him. We had a long conversation about his options: counseling?  States would be  very hard for him to get counseling. He is close to one of his sons, I'll be happy to talk to him if he calls the office to discuss his situation. He definitely needs more company and more help with ADLs. He apparently doesn't have many resources but the family could see into getting him Medicaid and maybe qualify for an ALF. My nurse did call [facility] to see about an ALF Current  medications are doxepin, Ativan and Lexapro 10 mg.We agreed to increase Lexapro to 20 mg. Urinary retention: Has a hard time managing  his catheter,  Starting today, the home health agency will help him. HTN: Controlled, had labs months and they were satisfactory Chronic back pain: I explained the patient why I can rx larger amounts of controlled substances, I suspect somebody in his environment was taking advantage of him and taking his pills. He seems to understand RTC 6 weeks.  Today, I spent more than 35   min with the patient: >50% of the time counseling regards depression, listening to  his concerns, counseling.

## 2016-02-11 NOTE — Telephone Encounter (Signed)
FYI. Will fax requested information.

## 2016-02-11 NOTE — Telephone Encounter (Signed)
Caller name: Estill Bamberg with [facility] Can be reached: [phone]  Reason for call: They are opening pt today for foley mgmt. 1x this week, 2x week for 2 weeks, then monthly to change cath. Pt having a lot of urin ouput (filling 19oz bag in approx 30 minutes at times). Pt having edema in lower left leg (pt coming in today). Please fax her med list and H&P on patient to [phone]. Ok to leave verbal order on VM

## 2016-02-11 NOTE — Patient Instructions (Signed)
Increase Lexapro from 10 mg to 20 mg every day  Come back in 6 weeks

## 2016-02-12 NOTE — Assessment & Plan Note (Signed)
Labs from care every where reviewed  Anxiety, now with depression and insomnia. Recently has developed depression, he states that he feels lonely, reports his family hardly ever visit him. We had a long conversation about his options: counseling?  States would be  very hard for him to get counseling. He is close to one of his sons, I'll be happy to talk to him if he calls the office to discuss his situation. He definitely needs more company and more help with ADLs. He apparently doesn't have many resources but the family could see into getting him Medicaid and maybe qualify for an ALF. My nurse did call [facility] to see about an ALF Current  medications are doxepin, Ativan and Lexapro 10 mg.We agreed to increase Lexapro to 20 mg. Urinary retention: Has a hard time managing his catheter,  Starting today, the home health agency will help him. HTN: Controlled, had labs months and they were satisfactory Chronic back pain: I explained the patient why I can rx larger amounts of controlled substances, I suspect somebody in his environment was taking advantage of him and taking his pills. He seems to understand RTC 6 weeks.

## 2016-02-13 ENCOUNTER — Telehealth: Payer: Self-pay | Admitting: Internal Medicine

## 2016-02-13 NOTE — Telephone Encounter (Signed)
Tried calling number given. Call can not be completed as dialed.

## 2016-02-13 NOTE — Telephone Encounter (Signed)
[place] called to inform that a PT evaluation was done yesterday and therapist does not think that ambulation is something that he will be able to work on since he needs a knee replacement. PT requests an order for OT to evaluate and treat. Please advise   Phone: [phone]

## 2016-02-13 NOTE — Telephone Encounter (Signed)
Please advise 

## 2016-02-13 NOTE — Telephone Encounter (Signed)
Ok to change

## 2016-02-14 ENCOUNTER — Other Ambulatory Visit: Payer: Self-pay | Admitting: Internal Medicine

## 2016-02-17 ENCOUNTER — Telehealth: Payer: Self-pay | Admitting: Internal Medicine

## 2016-02-17 ENCOUNTER — Ambulatory Visit (HOSPITAL_BASED_OUTPATIENT_CLINIC_OR_DEPARTMENT_OTHER): Payer: Commercial Managed Care - HMO

## 2016-02-17 ENCOUNTER — Ambulatory Visit: Payer: Commercial Managed Care - HMO | Admitting: Internal Medicine

## 2016-02-17 NOTE — Telephone Encounter (Signed)
FYI

## 2016-02-17 NOTE — Telephone Encounter (Signed)
-----   Message from Katha Hamming sent at 02/17/2016 10:32 AM EST ----- Regarding: ct  Wayne Nash did not show for his  Chest CT today.  His home # does not work and we could not leave a VM on Friday or today.  Thanks, Hoyle Sauer

## 2016-02-18 DIAGNOSIS — N401 Enlarged prostate with lower urinary tract symptoms: Secondary | ICD-10-CM | POA: Diagnosis not present

## 2016-02-18 DIAGNOSIS — R339 Retention of urine, unspecified: Secondary | ICD-10-CM | POA: Diagnosis not present

## 2016-02-18 DIAGNOSIS — Z9289 Personal history of other medical treatment: Secondary | ICD-10-CM | POA: Diagnosis not present

## 2016-02-19 ENCOUNTER — Telehealth: Payer: Self-pay | Admitting: Internal Medicine

## 2016-02-19 DIAGNOSIS — Z7982 Long term (current) use of aspirin: Secondary | ICD-10-CM | POA: Diagnosis not present

## 2016-02-19 DIAGNOSIS — Z87891 Personal history of nicotine dependence: Secondary | ICD-10-CM | POA: Diagnosis not present

## 2016-02-19 DIAGNOSIS — Z880 Allergy status to penicillin: Secondary | ICD-10-CM | POA: Diagnosis not present

## 2016-02-19 DIAGNOSIS — R339 Retention of urine, unspecified: Secondary | ICD-10-CM | POA: Diagnosis not present

## 2016-02-19 DIAGNOSIS — Z79899 Other long term (current) drug therapy: Secondary | ICD-10-CM | POA: Diagnosis not present

## 2016-02-19 DIAGNOSIS — R102 Pelvic and perineal pain: Secondary | ICD-10-CM | POA: Diagnosis not present

## 2016-02-19 DIAGNOSIS — N401 Enlarged prostate with lower urinary tract symptoms: Secondary | ICD-10-CM | POA: Diagnosis not present

## 2016-02-19 DIAGNOSIS — N400 Enlarged prostate without lower urinary tract symptoms: Secondary | ICD-10-CM | POA: Diagnosis not present

## 2016-02-19 DIAGNOSIS — I10 Essential (primary) hypertension: Secondary | ICD-10-CM | POA: Diagnosis not present

## 2016-02-19 MED ORDER — HYDROCODONE-ACETAMINOPHEN 7.5-325 MG PO TABS: 1.0000 | ORAL_TABLET | Freq: Two times a day (BID) | ORAL | 0 refills | Status: DC | PRN

## 2016-02-19 NOTE — Telephone Encounter (Signed)
Tried calling Pt-mobile number not accepting calls at this time and home number disconnected. Will place Rx at front desk for pick-up. Per PCP, okay for family member to pick up and take to pharmacy, however if something happens to Rx we will not be able to replace early AND will no longer be able to provide controlled substances to him.

## 2016-02-19 NOTE — Telephone Encounter (Signed)
Patient called stating that he just had a procedure done where they "went up in him" he states it had something to do with his kidneys. Patient states that he is in a lot of pain from this procedure and he only has two pain pills left. He also states that he does not have any way of getting here to get the prescription as he is in a wheel chair from the procedure. Please advise   Phone: [phone]

## 2016-02-19 NOTE — Telephone Encounter (Signed)
Urology note in Care Everywhere-seen yesterday.

## 2016-02-19 NOTE — Telephone Encounter (Signed)
Please call the patient, get the name of the doctor who did the procedure to get some documentation

## 2016-02-19 NOTE — Telephone Encounter (Signed)
Noted, okay #20, no refills

## 2016-02-19 NOTE — Telephone Encounter (Signed)
Rx printed, awaiting MD signature.  

## 2016-02-19 NOTE — Telephone Encounter (Signed)
Spoke w/ Richardson Landry, Pt's son, informed him why I was calling, have been unable to get in contact with Pt to inform him that Hydrocodone prescription was ready for pick up. Richardson Landry informed Pt has new mobile number, [phone] (chart updated). Also, informed me that Urology removed catheter yesterday and Pt currently back in ED at [facility] regarding urinary retention. Pt's son will come by and pick up prescription for Pt.

## 2016-02-20 MED FILL — HYDROCODON-APAP 7.5-325: 7.5-325 | 10 days supply | Qty: 20 | Fill #0

## 2016-02-26 ENCOUNTER — Telehealth: Payer: Self-pay | Admitting: Internal Medicine

## 2016-02-26 DIAGNOSIS — I10 Essential (primary) hypertension: Secondary | ICD-10-CM | POA: Diagnosis not present

## 2016-02-26 DIAGNOSIS — R911 Solitary pulmonary nodule: Secondary | ICD-10-CM | POA: Diagnosis not present

## 2016-02-26 DIAGNOSIS — Z466 Encounter for fitting and adjustment of urinary device: Secondary | ICD-10-CM | POA: Diagnosis not present

## 2016-02-26 DIAGNOSIS — J449 Chronic obstructive pulmonary disease, unspecified: Secondary | ICD-10-CM | POA: Diagnosis not present

## 2016-02-26 DIAGNOSIS — M170 Bilateral primary osteoarthritis of knee: Secondary | ICD-10-CM | POA: Diagnosis not present

## 2016-02-26 DIAGNOSIS — F419 Anxiety disorder, unspecified: Secondary | ICD-10-CM | POA: Diagnosis not present

## 2016-02-26 DIAGNOSIS — R339 Retention of urine, unspecified: Secondary | ICD-10-CM | POA: Diagnosis not present

## 2016-02-26 DIAGNOSIS — R627 Adult failure to thrive: Secondary | ICD-10-CM | POA: Diagnosis not present

## 2016-02-26 DIAGNOSIS — M4800 Spinal stenosis, site unspecified: Secondary | ICD-10-CM | POA: Diagnosis not present

## 2016-02-26 MED ORDER — LORAZEPAM 0.5 MG PO TABS: 0.5000 mg | ORAL_TABLET | Freq: Three times a day (TID) | ORAL | 0 refills | Status: DC | PRN

## 2016-02-26 NOTE — Telephone Encounter (Signed)
Spoke w/ Suezanne Jacquet at [place]. Informed Pt and his son brought and picked up prescription for Hydrocodone at 10:23 AM on 02/20/2016 for #20 tablets. Informed by Suezanne Jacquet, Pt was very rude to them, throwing money through the window and yelling/talking obscenities at his pharmacy techs to the point where he almost refused service to him.

## 2016-02-26 NOTE — Telephone Encounter (Signed)
Rx printed, awaiting MD signature.  

## 2016-02-26 NOTE — Telephone Encounter (Signed)
Okay to  #60, 0 refills. Advise pharmacy not to release but in 3 days when he is due

## 2016-02-26 NOTE — Telephone Encounter (Signed)
[facility], note informing pharmacy not to fill until 02/29/2016 provided.

## 2016-02-26 NOTE — Telephone Encounter (Signed)
Pt requested refill on 02/19/2016. Spoke w/ his son, Wayne Nash, on 02/19/2016 was informed Pt was in ED at [facility] and would have to come by to get his prescription for him (which PCP approved). Pt should not need refill. Please verify that printed prescription is not at front desk. Thank you.

## 2016-02-26 NOTE — Telephone Encounter (Signed)
Relation to [id number] Call back [place]: [id number] [place], [place] [phone] (Phone) [phone] (Fax)     Reason for call:  Patient requesting a refill LORazepam (ATIVAN) 0.5 MG tablet

## 2016-02-26 NOTE — Telephone Encounter (Signed)
Relation to [id number] Call back number:[phone]   Reason for call:  Patient states 20 tablets where prescribed 02/20/16 and he will run out Friday 02/27/16 patient states he's in pain due to the cather and has to take a cab to pick up Rx.  Please advise patient directly

## 2016-02-26 NOTE — Telephone Encounter (Signed)
Self. Refill request for HYDROcodone.   CB: [phone]

## 2016-02-26 NOTE — Telephone Encounter (Signed)
Rx refilled on 02/09/2016 #60 and 0RF. Pt sig: 1 tablet TID PRN. Please advise.

## 2016-02-27 DIAGNOSIS — R339 Retention of urine, unspecified: Secondary | ICD-10-CM | POA: Diagnosis not present

## 2016-02-27 MED ORDER — HYDROCODONE-ACETAMINOPHEN 7.5-325 MG PO TABS: 1.0000 | ORAL_TABLET | Freq: Two times a day (BID) | ORAL | 0 refills | Status: DC | PRN

## 2016-02-27 NOTE — Telephone Encounter (Signed)
Rx placed at front desk for pick up. Note placed on prescription for pharmacy not to fill until 03/01/2016.

## 2016-02-27 NOTE — Telephone Encounter (Signed)
Pt called in and was notified RX ready for pick up. Pt said Dr. Larose Kells stated he must pick up RX himself, no one else can pick up for him.

## 2016-02-27 NOTE — Telephone Encounter (Signed)
Rx printed, awaiting MD signature.  

## 2016-02-27 NOTE — Telephone Encounter (Signed)
Caller name: Calian Relationship to patient: self Can be reached: [phone]  Reason for call: Pt called stating he had msg from Dr. Larose Kells about pharmacies. Pt moved and states that [facility] [place] moving forward.

## 2016-02-27 NOTE — Telephone Encounter (Signed)
Spoke with the patient, next RF is due 03/01/2016, he will come back  and pick it up Monday, #20, no RF. We also discussed the following: 1. Needs to use one pharmacy only. 2. He is aware that mistreating my staff is grounds for dismissal and will not be tolerated. He verbalized understanding 3. I asked is some but is taking his medications and he said no.

## 2016-02-29 ENCOUNTER — Other Ambulatory Visit: Payer: Self-pay | Admitting: Internal Medicine

## 2016-03-01 ENCOUNTER — Telehealth: Payer: Self-pay

## 2016-03-01 MED FILL — HYDROCODON-APAP 7.5-325: 7.5-325 | 10 days supply | Qty: 20 | Fill #0

## 2016-03-01 NOTE — Telephone Encounter (Signed)
Received [place] from [facility]. Form completed, signed and faxed to [phone]. Form sent for scanning.

## 2016-03-02 ENCOUNTER — Encounter: Payer: Self-pay | Admitting: Internal Medicine

## 2016-03-02 DIAGNOSIS — Z79899 Other long term (current) drug therapy: Secondary | ICD-10-CM | POA: Diagnosis not present

## 2016-03-02 DIAGNOSIS — N400 Enlarged prostate without lower urinary tract symptoms: Secondary | ICD-10-CM | POA: Diagnosis not present

## 2016-03-02 DIAGNOSIS — Z79891 Long term (current) use of opiate analgesic: Secondary | ICD-10-CM | POA: Diagnosis not present

## 2016-03-02 DIAGNOSIS — R339 Retention of urine, unspecified: Secondary | ICD-10-CM | POA: Diagnosis not present

## 2016-03-04 ENCOUNTER — Telehealth: Payer: Self-pay | Admitting: Internal Medicine

## 2016-03-04 MED ORDER — CARVEDILOL 6.25 MG PO TABS: 6.2500 mg | ORAL_TABLET | Freq: Every day | ORAL | 1 refills | Status: AC

## 2016-03-04 NOTE — Telephone Encounter (Signed)
Carvedilol Rx sent. Lorazepam was refilled on 02/26/2016 for #60 tabs.

## 2016-03-04 NOTE — Telephone Encounter (Signed)
Patient called requesting a refill of carvedilol (COREG) 6.25 MG tablet and LORazepam (ATIVAN) 0.5 MG tablet Please advise   [id number] [place], [address]

## 2016-03-05 ENCOUNTER — Other Ambulatory Visit: Payer: Self-pay | Admitting: Internal Medicine

## 2016-03-05 DIAGNOSIS — J449 Chronic obstructive pulmonary disease, unspecified: Secondary | ICD-10-CM | POA: Diagnosis not present

## 2016-03-05 DIAGNOSIS — F419 Anxiety disorder, unspecified: Secondary | ICD-10-CM | POA: Diagnosis not present

## 2016-03-05 DIAGNOSIS — M4800 Spinal stenosis, site unspecified: Secondary | ICD-10-CM | POA: Diagnosis not present

## 2016-03-05 DIAGNOSIS — I10 Essential (primary) hypertension: Secondary | ICD-10-CM | POA: Diagnosis not present

## 2016-03-05 DIAGNOSIS — R627 Adult failure to thrive: Secondary | ICD-10-CM | POA: Diagnosis not present

## 2016-03-05 DIAGNOSIS — Z466 Encounter for fitting and adjustment of urinary device: Secondary | ICD-10-CM | POA: Diagnosis not present

## 2016-03-05 DIAGNOSIS — M170 Bilateral primary osteoarthritis of knee: Secondary | ICD-10-CM | POA: Diagnosis not present

## 2016-03-05 DIAGNOSIS — R339 Retention of urine, unspecified: Secondary | ICD-10-CM | POA: Diagnosis not present

## 2016-03-05 DIAGNOSIS — R911 Solitary pulmonary nodule: Secondary | ICD-10-CM | POA: Diagnosis not present

## 2016-03-05 NOTE — Telephone Encounter (Signed)
[facility] [place], [place].

## 2016-03-05 NOTE — Telephone Encounter (Signed)
Patient called stating the pharmacy did not have these medications. I called the pharmacy, they do have the Carvedilol however, they stated the insurance company will not allow it to be filled until 03/07/16. Pharmacy also states that they do not have the prescription for LORazepam, the last time they have it being filled was 01/21/17 at that location. Please advise.   Patient phone: [phone]

## 2016-03-11 ENCOUNTER — Other Ambulatory Visit: Payer: Self-pay | Admitting: Internal Medicine

## 2016-03-15 ENCOUNTER — Telehealth: Payer: Self-pay

## 2016-03-15 ENCOUNTER — Telehealth: Payer: Self-pay | Admitting: Internal Medicine

## 2016-03-15 MED ORDER — HYDROCODONE-ACETAMINOPHEN 7.5-325 MG PO TABS: 1.0000 | ORAL_TABLET | Freq: Two times a day (BID) | ORAL | 0 refills | Status: DC | PRN

## 2016-03-15 NOTE — Telephone Encounter (Signed)
Patient is requesting a refill of HYDROcodone-acetaminophen (NORCO) 7.5-325 MG tablet Patient is requesting if a friend of his would be able to pick up his medication prescription? He states if not, his friend will pick him up and bring him to get the prescription. It is just harder for him to get out since he is in a wheel chair. Please advise  Phone: [phone]

## 2016-03-15 NOTE — Telephone Encounter (Signed)
Okay #20, no refills. He needs to come personally

## 2016-03-15 NOTE — Telephone Encounter (Signed)
Rx printed, awaiting MD signature.  

## 2016-03-15 NOTE — Telephone Encounter (Signed)
UDS: 03/02/2016  Negative for Hydrocodone (may have run out) Positive for Lorazepam   Moderate risk per PCP 03/12/2016 Recheck UDS in 2-3 months

## 2016-03-15 NOTE — Telephone Encounter (Signed)
Please inform Pt that Rx has been placed at front desk for pick up at his convenience. Must be picked up by Pt himself. Thank you.

## 2016-03-15 NOTE — Telephone Encounter (Signed)
Patient aware.

## 2016-03-17 MED FILL — HYDROCODON-APAP 7.5-325: 7.5-325 | 10 days supply | Qty: 20 | Fill #0

## 2016-03-18 ENCOUNTER — Other Ambulatory Visit: Payer: Self-pay | Admitting: Internal Medicine

## 2016-03-18 NOTE — Telephone Encounter (Signed)
Ok 60, no RF 

## 2016-03-18 NOTE — Telephone Encounter (Signed)
Rx printed, awaiting MD signature.  

## 2016-03-18 NOTE — Telephone Encounter (Signed)
Pt is requesting refill on Lorazepam.  Last OV: 02/11/2016 Last Fill: 02/26/2016 #60 and 0RF (Pt sig:1 tablet TID PRN) UDS: 03/02/2016 Low risk  Please advise.

## 2016-03-18 NOTE — Telephone Encounter (Signed)
[facility] [place]

## 2016-03-19 ENCOUNTER — Other Ambulatory Visit: Payer: Self-pay | Admitting: Internal Medicine

## 2016-03-19 DIAGNOSIS — R339 Retention of urine, unspecified: Secondary | ICD-10-CM | POA: Diagnosis not present

## 2016-03-23 DIAGNOSIS — N400 Enlarged prostate without lower urinary tract symptoms: Secondary | ICD-10-CM | POA: Diagnosis not present

## 2016-03-23 DIAGNOSIS — R338 Other retention of urine: Secondary | ICD-10-CM | POA: Diagnosis not present

## 2016-03-24 ENCOUNTER — Ambulatory Visit: Payer: Commercial Managed Care - HMO | Admitting: Internal Medicine

## 2016-03-25 ENCOUNTER — Ambulatory Visit (INDEPENDENT_AMBULATORY_CARE_PROVIDER_SITE_OTHER): Payer: Medicare HMO | Admitting: Internal Medicine

## 2016-03-25 ENCOUNTER — Encounter: Payer: Self-pay | Admitting: Internal Medicine

## 2016-03-25 VITALS — BP 132/78 | HR 68 | Temp 98.1°F | Resp 14 | Ht 61.0 in | Wt 215.0 lb

## 2016-03-25 DIAGNOSIS — F418 Other specified anxiety disorders: Secondary | ICD-10-CM

## 2016-03-25 DIAGNOSIS — F32A Depression, unspecified: Secondary | ICD-10-CM

## 2016-03-25 DIAGNOSIS — F419 Anxiety disorder, unspecified: Principal | ICD-10-CM

## 2016-03-25 DIAGNOSIS — R739 Hyperglycemia, unspecified: Secondary | ICD-10-CM | POA: Diagnosis not present

## 2016-03-25 DIAGNOSIS — I10 Essential (primary) hypertension: Secondary | ICD-10-CM | POA: Diagnosis not present

## 2016-03-25 DIAGNOSIS — M545 Low back pain: Secondary | ICD-10-CM

## 2016-03-25 DIAGNOSIS — F329 Major depressive disorder, single episode, unspecified: Secondary | ICD-10-CM

## 2016-03-25 DIAGNOSIS — G8929 Other chronic pain: Secondary | ICD-10-CM

## 2016-03-25 LAB — HEMOGLOBIN A1C: HEMOGLOBIN A1C: 5.7 % (ref 4.6–6.5)

## 2016-03-25 LAB — BASIC METABOLIC PANEL
BUN: 23 mg/dL (ref 6–23)
CHLORIDE: 104 meq/L (ref 96–112)
CO2: 36 meq/L — AB (ref 19–32)
Calcium: 9.4 mg/dL (ref 8.4–10.5)
Creatinine, Ser: 0.55 mg/dL (ref 0.40–1.50)
GFR: 152.11 mL/min (ref >60.00)
Glucose, Bld: 112 mg/dL — ABNORMAL HIGH (ref 70–99)
POTASSIUM: 4 meq/L (ref 3.5–5.1)
SODIUM: 144 meq/L (ref 135–145)

## 2016-03-25 MED ORDER — ESCITALOPRAM OXALATE 20 MG PO TABS: 20.0000 mg | ORAL_TABLET | Freq: Every day | ORAL | 1 refills | Status: AC

## 2016-03-25 MED ORDER — HYDROCODONE-ACETAMINOPHEN 7.5-325 MG PO TABS: 1.0000 | ORAL_TABLET | Freq: Two times a day (BID) | ORAL | 0 refills | Status: DC | PRN

## 2016-03-25 MED FILL — HYDROCODON-APAP 7.5-325: 7.5-325 | 10 days supply | Qty: 20 | Fill #0

## 2016-03-25 NOTE — Progress Notes (Signed)
Pre visit review using our clinic review tool, if applicable. No additional management support is needed unless otherwise documented below in the visit note. 

## 2016-03-25 NOTE — Progress Notes (Signed)
Subjective:    Patient ID: Wayne Nash, male    DOB: 06-16-1933, 83 y.o.   MRN: [id number]  DOS:  03/25/2016 Type of visit - description :  Routine follow-up Interval history: Since the last visit, we increased Lexapro, anxiety and depression are about the same. Chronic back pain: On hydrocodone, needs a refill   Review of Systems No suicidal ideas  Past Medical History:  Diagnosis Date  . H/O urinary retention   . Hyperlipidemia   . Hypertension   . Primary osteoarthritis of left knee   . Primary osteoarthritis of right knee     Past Surgical History:  Procedure Laterality Date  . APPENDECTOMY    . LUMBAR DISC SURGERY     Dr Shellia Carwin  . SKIN BIOPSY      Social History   Social History  . Marital status: Divorced    Spouse name: N/A  . Number of children: 3  . Years of education: N/A   Occupational History  . 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 Smoking status: Former Smoker    Quit date: 02/22/1994  . Smokeless tobacco: Never Used     Comment: smoked 1953-1996, up to 1.5 ppd  . Alcohol use No     Comment:  quit 1985  . Drug use: No  . Sexual activity: Not on file   Other Topics Concern  . Not on file   Social History Narrative   3 children, all live within 11 miles   SonBraulio Conte [id number]    Steve's daughter -- Earnest Bailey   7 Gc, 3 GGchildren   As off 02-2016: lives by himself           Allergies as of 03/25/2016      Reactions   Penicillins    REACTION: SWELLING/HIVES      Medication List       Accurate as of 03/25/16  5:29 PM. Always use your most recent med list.          aspirin 81 MG tablet Take 81 mg by mouth daily.   atorvastatin 20 MG tablet Commonly known as:  LIPITOR Take 1 tablet (20 mg total) by mouth daily.   carvedilol 6.25 MG tablet Commonly known as:  COREG Take 1 tablet (6.25 mg total) by mouth daily.   ciprofloxacin 500 MG tablet Commonly  known as:  CIPRO Take 500 mg by mouth 2 (two) times daily.   cyclobenzaprine 5 MG tablet Commonly known as:  FLEXERIL Take 1 tablet (5 mg total) by mouth at bedtime as needed for muscle spasms.   doxepin 10 MG capsule Commonly known as:  SINEQUAN Take 1 capsule (10 mg total) by mouth at bedtime as needed.   escitalopram 20 MG tablet Commonly known as:  LEXAPRO Take 1 tablet (20 mg total) by mouth daily.   furosemide 20 MG tablet Commonly known as:  LASIX Take 2 tablets (40 mg total) by mouth daily.   gabapentin 300 MG capsule Commonly known as:  NEURONTIN Take 1 capsule (300 mg total) by mouth 2 (two) times daily.   HYDROcodone-acetaminophen 7.5-325 MG tablet Commonly known as:  NORCO Take 1 tablet by mouth 2 (two) times daily as needed for moderate pain.   LORazepam 0.5 MG tablet Commonly known as:  ATIVAN Take 1 tablet (0.5 mg total) by mouth 3 (three) times daily as needed.   potassium chloride 10 MEQ  tablet Commonly known as:  K-DUR Take 1 tablet (10 mEq total) by mouth daily.   tamsulosin 0.4 MG Caps capsule Commonly known as:  FLOMAX Take 1 capsule (0.4 mg total) by mouth daily.   tiotropium 18 MCG inhalation capsule Commonly known as:  SPIRIVA Place 1 capsule (18 mcg total) into inhaler and inhale daily.          Objective:   Physical Exam BP 132/78 (BP Location: Left Arm, Patient Position: Sitting, Cuff Size: Normal)   Pulse 68   Temp 98.1 F (36.7 C) (Oral)   Resp 14   Ht 5\' 1"  (1.549 m)   Wt 215 lb (97.5 kg)   SpO2 98%   BMI 40.62 kg/m  General:   Well developed, sitting in a wheelchair, in no distress.  HEENT:  Normocephalic . Face symmetric, atraumatic Lungs:  CTA B Normal respiratory effort, no intercostal retractions, no accessory muscle use. Heart: RRR,  no murmur.   Neurologic:  alert & oriented X3.  Speech normal, gait not tested Psych--  Cognition and judgment appear intact.  Cooperative with normal attention span and  concentration.  Behavior appropriate. Seems calm today, looks more  frustrated than depressed or anxious.     Assessment & Plan:   Assessment  Prediabetes  HTN EDEMA, LE, L>R chronic (echo 09-2013 ok, [place] 03-2014 no DVT) Hyperlipidemia Anxiety, insomnia --prn doxepin,ativan B12 deficiency COPD (dx 2016: former smoker, DOE, PFTs rx but not done as off 03-2015) MSK --Gait d/o likely multifactorial --Chronic back pain--- hydrocodone (UDS), started gabapentin 12-2014 --DJD Rash (persistent throughout 2015--scabies) Pulmonary Nodule, last CT  09-2014 recheck 1 year  H/o colelithiasis Urinary retention: Onset 11/07/2015, urology Dr. Burt Knack. Family contact   SonBraulio Conte [id number]      PLAN Anxiety, depression and insomnia: He is on doxepin and Ativan, Lexapro was increased from 10 to 20 mg. Since then feels maybe slightly better but not much. Again he is frustrated by his health in general, he feels lonely. Denies suicidal ideas. I counseling him to the best of my ability,  we talk about possibly change medication (Cymbalta?) but at the end decided not to change. Takes Ativan sometimes 3 times a day, encouraged to stay on bid. Also encourage a counselor. Information provided. Currently getting most of the help from his son Richardson Landry and granddaughter Earnest Bailey.  HTN: Seems control, continue potassium, Lasix, carvedilol, check a BMP Prediabetes: Check A1c Back pain: We'll continue limiting hydrocodone Rx to 20 tablets every 10 days. Prescription printed today. RTC 3-4 months     Labs from care every where reviewed  Anxiety, now with depression and insomnia. Recently has developed depression, he states that he feels lonely, reports his family hardly ever visit him. We had a long conversation about his options: counseling?  States would be  very hard for him to get counseling. He is close to one of his sons, I'll be happy to talk to him if he calls the office to discuss his situation. He  definitely needs more company and more help with ADLs. He apparently doesn't have many resources but the family could see into getting him Medicaid and maybe qualify for an ALF. My nurse did call [facility] to see about an ALF Current  medications are doxepin, Ativan and Lexapro 10 mg.We agreed to increase Lexapro to 20 mg. Urinary retention: Has a hard time managing his catheter,  Starting today, the home health agency will help him. HTN: Controlled, had labs months and  they were satisfactory Chronic back pain: I explained the patient why I can rx larger amounts of controlled substances, I suspect somebody in his environment was taking advantage of him and taking his pills. He seems to understand RTC 6 weeks.  Today, I spent more than 35   min with the patient: >50% of the time counseling regards depression, listening to his concerns, counseling.

## 2016-03-25 NOTE — Patient Instructions (Signed)
GO TO THE LAB : Get the blood work     GO TO THE FRONT DESK Schedule your next appointment for a  routine checkup in 2 or  3 months

## 2016-03-25 NOTE — Assessment & Plan Note (Signed)
PLAN Anxiety, depression and insomnia: He is on doxepin and Ativan, Lexapro was increased from 10 to 20 mg. Since then feels maybe slightly better but not much. Again he is frustrated by his health in general, he feels lonely. Denies suicidal ideas. I counseling him to the best of my ability,  we talk about possibly change medication (Cymbalta?) but at the end decided not to change. Takes Ativan sometimes 3 times a day, encouraged to stay on bid. Also encourage a counselor. Information provided. Currently getting most of the help from his son Wayne Nash and granddaughter Wayne Nash.  HTN: Seems control, continue potassium, Lasix, carvedilol, check a BMP Prediabetes: Check A1c Back pain: We'll continue limiting hydrocodone Rx to 20 tablets every 10 days. Prescription printed today. RTC 3-4 months

## 2016-03-26 DIAGNOSIS — R339 Retention of urine, unspecified: Secondary | ICD-10-CM | POA: Diagnosis not present

## 2016-03-26 DIAGNOSIS — I10 Essential (primary) hypertension: Secondary | ICD-10-CM | POA: Diagnosis not present

## 2016-03-26 DIAGNOSIS — N400 Enlarged prostate without lower urinary tract symptoms: Secondary | ICD-10-CM | POA: Diagnosis not present

## 2016-04-02 ENCOUNTER — Telehealth: Payer: Self-pay | Admitting: Internal Medicine

## 2016-04-02 MED ORDER — HYDROCODONE-ACETAMINOPHEN 7.5-325 MG PO TABS: 1.0000 | ORAL_TABLET | Freq: Two times a day (BID) | ORAL | 0 refills | Status: AC | PRN

## 2016-04-02 NOTE — Telephone Encounter (Signed)
Please inform Pt that Rx has been placed at front desk for pick up at his convenience. (Must be picked up by Pt himself). Thank you.

## 2016-04-02 NOTE — Telephone Encounter (Signed)
Rx printed, awaiting MD signature.  

## 2016-04-02 NOTE — Telephone Encounter (Signed)
Caller name: Relationship to patient: Self Can be reached: [phone]  Pharmacy:  Reason for call: Request refill on HYDROcodone-acetaminophen ([place]) 7.5-325 MG tablet [id number]  Please call patient when Rx is ready. Needs to pick up today because he only has 2 pills left.

## 2016-04-02 NOTE — Telephone Encounter (Signed)
Patient informed. Will pick up either today or early Monday morning before his surgery

## 2016-04-02 NOTE — Telephone Encounter (Signed)
Okay to refill the usual amount

## 2016-04-05 DIAGNOSIS — Z7982 Long term (current) use of aspirin: Secondary | ICD-10-CM | POA: Diagnosis not present

## 2016-04-05 DIAGNOSIS — Z79891 Long term (current) use of opiate analgesic: Secondary | ICD-10-CM | POA: Diagnosis not present

## 2016-04-05 DIAGNOSIS — R339 Retention of urine, unspecified: Secondary | ICD-10-CM | POA: Diagnosis not present

## 2016-04-05 DIAGNOSIS — Z880 Allergy status to penicillin: Secondary | ICD-10-CM | POA: Diagnosis not present

## 2016-04-05 DIAGNOSIS — Z87891 Personal history of nicotine dependence: Secondary | ICD-10-CM | POA: Diagnosis not present

## 2016-04-05 DIAGNOSIS — Z79899 Other long term (current) drug therapy: Secondary | ICD-10-CM | POA: Diagnosis not present

## 2016-04-05 DIAGNOSIS — I10 Essential (primary) hypertension: Secondary | ICD-10-CM | POA: Diagnosis not present

## 2016-04-05 DIAGNOSIS — N401 Enlarged prostate with lower urinary tract symptoms: Secondary | ICD-10-CM | POA: Diagnosis not present

## 2016-04-06 ENCOUNTER — Other Ambulatory Visit: Payer: Self-pay | Admitting: Internal Medicine

## 2016-04-06 ENCOUNTER — Telehealth: Payer: Self-pay | Admitting: Internal Medicine

## 2016-04-06 DIAGNOSIS — G8929 Other chronic pain: Secondary | ICD-10-CM

## 2016-04-06 MED FILL — HYDROCODON-APAP 7.5-325: 7.5-325 | 10 days supply | Qty: 20 | Fill #0

## 2016-04-06 NOTE — Telephone Encounter (Signed)
thx

## 2016-04-06 NOTE — Telephone Encounter (Signed)
Pt is requesting refill on Lorazepam.  Last OV: 03/25/2016 Last Fill: 03/18/2016 #60 and 0RF (Pt sig: 1 tab TID PRN) UDS: 03/02/2016 Moderate risk (recheck 2-3 months)  Please advise.

## 2016-04-06 NOTE — Telephone Encounter (Signed)
[facility] [place] [place]

## 2016-04-06 NOTE — Telephone Encounter (Addendum)
Patient would like to speak with office manager / head nurse best # [phone]

## 2016-04-06 NOTE — Telephone Encounter (Signed)
Pt's son Merry Proud) came in early in the morning to pick up rx for pt, son stated pt was in the car coming with him to pick up his prescription and that pt had accident with his coloscopy bag and felt uncomfortable to come and pick up to get his prescription and that is why he sent his son to pick up for him (son presented his ID and also showed pt's ID), I offer to go to the car to get pt's signature so that I could give it directly to pt his prescription but son stated since he felt uncomfortable that he will come back to let me know if I can get his (Wayne Nash) signature. Pt's Merry Proud) son did not come back. Prescription was put back at front office drawer.

## 2016-04-06 NOTE — Telephone Encounter (Signed)
Pt notified of instructions and verbalized understanding. He states he is taking medications as prescribed and denies any diversion of meds. Pt came back to office in person w/ his son later today and picked up rx himself. He would like to continue seeing Dr. Larose Kells and verbalizes understanding that Dr. Larose Kells will no longer prescribe controlled substances. He is agreeable to pain management referral-referral placed as directed.

## 2016-04-06 NOTE — Telephone Encounter (Signed)
Unfortunately, we continue running into problems that are very suspicious for diversion  of his medication, see note below. Also the patient told me recently he was taking Ativan 1 tablet twice a day and already called for a EARLY ativan refill. Please call the patient:  From this point on, I would not prescribe any more controlled substances. I would recommend him to get a new physician. If he decides to continue be seen here that is okay, I will simply not prescribe more controlled substances. If so desired, refer to pain management. If this continue to be an issue, or if he or his family are abusive to me or my staff (as before) I will be forced to discharge him from this office.

## 2016-04-06 NOTE — Telephone Encounter (Signed)
Although the prescription say 3 times a day, the patient reported me recently that he take it twice a day. He is early. Will change prescription to twice a day, #60, no refills. Advise patient that 60 tablets will be his month supply.

## 2016-04-09 NOTE — Telephone Encounter (Signed)
Spoke with patient in regards to his pain medications. He wanted to let Dr. Larose Kells know that he was not feeling well that day which is why he did not come up to pick up his prescription but that he does not have any hard feelings and he is agreeable to the Pain management referral. Answered patients questions and told him I would pass his message along to Dr. Larose Kells

## 2016-04-12 ENCOUNTER — Other Ambulatory Visit: Payer: Self-pay | Admitting: Internal Medicine

## 2016-04-12 NOTE — Telephone Encounter (Signed)
Pt informed on 04/06/2016 that no further controlled substances will be given from this office.

## 2016-04-12 NOTE — Telephone Encounter (Signed)
Still in clinical review, awaiting appt

## 2016-04-12 NOTE — Telephone Encounter (Signed)
Called patient to reinforce that he will no longer be able to receive controlled substances from our office.  He became tearful, stating that he's in pain and doesn't know what to do.  Rate current pain level at a 5/10.  Advised him to take OTC tylenol until appt with pain management.  Pt had questions about pain management. Stating he wasn't sure what they could do for him.  Explained to him the benefits of pain management.  He stated understanding and is still agreeable to going.  No appt scheduled as of yet.  Will check with referral coordinator regarding appt for pain management.     Jen---any updates?

## 2016-04-12 NOTE — Telephone Encounter (Signed)
No more controlled substances from this office

## 2016-04-12 NOTE — Telephone Encounter (Signed)
Caller name: Thuy Relation to pt: self Call back number: [phone] Pharmacy:  Reason for call: Pt is requesting refill on HYDROcodone-acetaminophen (NORCO) 7.5-325 MG tablet.(last 04-02-2016), pt states is in pain. Please advise.

## 2016-04-12 NOTE — Telephone Encounter (Signed)
Noted  

## 2016-04-13 NOTE — Telephone Encounter (Signed)
We can refer him to pain management, if pain is related to the insertion of the bladder catheter, rec to d/w urology, he may be able to lubricate the catheter with lidocaine gel.

## 2016-04-13 NOTE — Telephone Encounter (Signed)
Called patient and discussed provider's recommendations below.  He was made aware that pain management referral has already been placed and currently we are waiting to hear back regarding an appt.  Also discussed provider's recommendation to contact his urologist as he may be able to lubricate the catheter with lidocaine to assist with pain.  Pt was agreeable to calling urologist.  Urologist number given to patient.

## 2016-04-13 NOTE — Telephone Encounter (Signed)
Forwarding to management and Dr. Larose Kells.

## 2016-04-13 NOTE — Telephone Encounter (Signed)
Pt called in, he said that he completely understands that provider isn't going to prescribe controlled substances. Pt says that he is still in pain and need something for the pain to help. Pt would like to speak with someone further.

## 2016-04-13 NOTE — Telephone Encounter (Signed)
Pt made aware

## 2016-04-22 DIAGNOSIS — I10 Essential (primary) hypertension: Secondary | ICD-10-CM | POA: Diagnosis not present

## 2016-04-22 DIAGNOSIS — N401 Enlarged prostate with lower urinary tract symptoms: Secondary | ICD-10-CM | POA: Diagnosis not present

## 2016-04-22 DIAGNOSIS — N4889 Other specified disorders of penis: Secondary | ICD-10-CM | POA: Diagnosis not present

## 2016-04-22 DIAGNOSIS — R338 Other retention of urine: Secondary | ICD-10-CM | POA: Diagnosis not present

## 2016-04-25 ENCOUNTER — Other Ambulatory Visit: Payer: Self-pay | Admitting: Internal Medicine

## 2016-04-26 DIAGNOSIS — I517 Cardiomegaly: Secondary | ICD-10-CM | POA: Diagnosis not present

## 2016-04-26 DIAGNOSIS — I080 Rheumatic disorders of both mitral and aortic valves: Secondary | ICD-10-CM | POA: Diagnosis not present

## 2016-04-26 DIAGNOSIS — R339 Retention of urine, unspecified: Secondary | ICD-10-CM | POA: Diagnosis not present

## 2016-04-26 DIAGNOSIS — M1612 Unilateral primary osteoarthritis, left hip: Secondary | ICD-10-CM | POA: Diagnosis not present

## 2016-04-26 DIAGNOSIS — M25552 Pain in left hip: Secondary | ICD-10-CM | POA: Diagnosis not present

## 2016-04-26 DIAGNOSIS — D62 Acute posthemorrhagic anemia: Secondary | ICD-10-CM | POA: Diagnosis not present

## 2016-04-26 DIAGNOSIS — F05 Delirium due to known physiological condition: Secondary | ICD-10-CM | POA: Diagnosis not present

## 2016-04-26 DIAGNOSIS — R531 Weakness: Secondary | ICD-10-CM | POA: Diagnosis not present

## 2016-04-26 DIAGNOSIS — F333 Major depressive disorder, recurrent, severe with psychotic symptoms: Secondary | ICD-10-CM | POA: Diagnosis not present

## 2016-04-26 DIAGNOSIS — K661 Hemoperitoneum: Secondary | ICD-10-CM | POA: Diagnosis not present

## 2016-04-26 DIAGNOSIS — Z466 Encounter for fitting and adjustment of urinary device: Secondary | ICD-10-CM | POA: Diagnosis not present

## 2016-04-26 DIAGNOSIS — J9811 Atelectasis: Secondary | ICD-10-CM | POA: Diagnosis not present

## 2016-04-26 DIAGNOSIS — Z9981 Dependence on supplemental oxygen: Secondary | ICD-10-CM | POA: Diagnosis not present

## 2016-04-26 DIAGNOSIS — T148XXA Other injury of unspecified body region, initial encounter: Secondary | ICD-10-CM | POA: Diagnosis not present

## 2016-04-26 DIAGNOSIS — J9601 Acute respiratory failure with hypoxia: Secondary | ICD-10-CM | POA: Diagnosis not present

## 2016-04-26 DIAGNOSIS — K8020 Calculus of gallbladder without cholecystitis without obstruction: Secondary | ICD-10-CM | POA: Diagnosis not present

## 2016-04-26 DIAGNOSIS — M545 Low back pain: Secondary | ICD-10-CM | POA: Diagnosis not present

## 2016-04-26 DIAGNOSIS — D649 Anemia, unspecified: Secondary | ICD-10-CM | POA: Diagnosis not present

## 2016-04-26 DIAGNOSIS — N400 Enlarged prostate without lower urinary tract symptoms: Secondary | ICD-10-CM | POA: Diagnosis not present

## 2016-04-26 DIAGNOSIS — I10 Essential (primary) hypertension: Secondary | ICD-10-CM | POA: Diagnosis not present

## 2016-04-26 DIAGNOSIS — R1032 Left lower quadrant pain: Secondary | ICD-10-CM | POA: Diagnosis not present

## 2016-04-26 DIAGNOSIS — J90 Pleural effusion, not elsewhere classified: Secondary | ICD-10-CM | POA: Diagnosis not present

## 2016-04-26 DIAGNOSIS — J449 Chronic obstructive pulmonary disease, unspecified: Secondary | ICD-10-CM | POA: Diagnosis not present

## 2016-04-26 DIAGNOSIS — N401 Enlarged prostate with lower urinary tract symptoms: Secondary | ICD-10-CM | POA: Diagnosis not present

## 2016-04-26 DIAGNOSIS — R0902 Hypoxemia: Secondary | ICD-10-CM | POA: Diagnosis not present

## 2016-04-26 NOTE — Telephone Encounter (Signed)
PER PCP, NO FURTHER CONTROLLED SUBSTANCES FROM OUR OFFICE, SEE TELEPHONE NOTE DATE 04/06/2016. RX DECLINED.

## 2016-04-30 DIAGNOSIS — J441 Chronic obstructive pulmonary disease with (acute) exacerbation: Secondary | ICD-10-CM | POA: Diagnosis not present

## 2016-04-30 DIAGNOSIS — R339 Retention of urine, unspecified: Secondary | ICD-10-CM | POA: Diagnosis not present

## 2016-04-30 DIAGNOSIS — N390 Urinary tract infection, site not specified: Secondary | ICD-10-CM | POA: Diagnosis not present

## 2016-04-30 DIAGNOSIS — I10 Essential (primary) hypertension: Secondary | ICD-10-CM | POA: Diagnosis not present

## 2016-04-30 DIAGNOSIS — Z880 Allergy status to penicillin: Secondary | ICD-10-CM | POA: Diagnosis not present

## 2016-04-30 DIAGNOSIS — Z66 Do not resuscitate: Secondary | ICD-10-CM | POA: Diagnosis not present

## 2016-04-30 DIAGNOSIS — J9621 Acute and chronic respiratory failure with hypoxia: Secondary | ICD-10-CM | POA: Diagnosis not present

## 2016-04-30 DIAGNOSIS — I5040 Unspecified combined systolic (congestive) and diastolic (congestive) heart failure: Secondary | ICD-10-CM | POA: Diagnosis not present

## 2016-04-30 DIAGNOSIS — Z7982 Long term (current) use of aspirin: Secondary | ICD-10-CM | POA: Diagnosis not present

## 2016-04-30 DIAGNOSIS — D6489 Other specified anemias: Secondary | ICD-10-CM | POA: Diagnosis not present

## 2016-04-30 DIAGNOSIS — J9811 Atelectasis: Secondary | ICD-10-CM | POA: Diagnosis not present

## 2016-04-30 DIAGNOSIS — N401 Enlarged prostate with lower urinary tract symptoms: Secondary | ICD-10-CM | POA: Diagnosis not present

## 2016-04-30 DIAGNOSIS — Z87891 Personal history of nicotine dependence: Secondary | ICD-10-CM | POA: Diagnosis not present

## 2016-04-30 DIAGNOSIS — F333 Major depressive disorder, recurrent, severe with psychotic symptoms: Secondary | ICD-10-CM | POA: Diagnosis not present

## 2016-04-30 DIAGNOSIS — R410 Disorientation, unspecified: Secondary | ICD-10-CM | POA: Diagnosis not present

## 2016-04-30 DIAGNOSIS — N400 Enlarged prostate without lower urinary tract symptoms: Secondary | ICD-10-CM | POA: Diagnosis not present

## 2016-04-30 DIAGNOSIS — Z466 Encounter for fitting and adjustment of urinary device: Secondary | ICD-10-CM | POA: Diagnosis not present

## 2016-04-30 DIAGNOSIS — R4182 Altered mental status, unspecified: Secondary | ICD-10-CM | POA: Diagnosis not present

## 2016-04-30 DIAGNOSIS — J449 Chronic obstructive pulmonary disease, unspecified: Secondary | ICD-10-CM | POA: Diagnosis not present

## 2016-04-30 DIAGNOSIS — I509 Heart failure, unspecified: Secondary | ICD-10-CM | POA: Diagnosis not present

## 2016-04-30 DIAGNOSIS — Z9981 Dependence on supplemental oxygen: Secondary | ICD-10-CM | POA: Diagnosis not present

## 2016-04-30 DIAGNOSIS — D649 Anemia, unspecified: Secondary | ICD-10-CM | POA: Diagnosis not present

## 2016-04-30 DIAGNOSIS — K661 Hemoperitoneum: Secondary | ICD-10-CM | POA: Diagnosis not present

## 2016-04-30 DIAGNOSIS — R0902 Hypoxemia: Secondary | ICD-10-CM | POA: Diagnosis not present

## 2016-04-30 DIAGNOSIS — R0689 Other abnormalities of breathing: Secondary | ICD-10-CM | POA: Diagnosis not present

## 2016-04-30 DIAGNOSIS — R269 Unspecified abnormalities of gait and mobility: Secondary | ICD-10-CM | POA: Diagnosis not present

## 2016-04-30 DIAGNOSIS — T148XXA Other injury of unspecified body region, initial encounter: Secondary | ICD-10-CM | POA: Diagnosis not present

## 2016-04-30 DIAGNOSIS — Z9289 Personal history of other medical treatment: Secondary | ICD-10-CM | POA: Diagnosis not present

## 2016-04-30 DIAGNOSIS — J9622 Acute and chronic respiratory failure with hypercapnia: Secondary | ICD-10-CM | POA: Diagnosis not present

## 2016-04-30 DIAGNOSIS — Z79899 Other long term (current) drug therapy: Secondary | ICD-10-CM | POA: Diagnosis not present

## 2016-05-05 DIAGNOSIS — J449 Chronic obstructive pulmonary disease, unspecified: Secondary | ICD-10-CM | POA: Diagnosis not present

## 2016-05-05 DIAGNOSIS — D6489 Other specified anemias: Secondary | ICD-10-CM | POA: Diagnosis not present

## 2016-05-05 DIAGNOSIS — N400 Enlarged prostate without lower urinary tract symptoms: Secondary | ICD-10-CM | POA: Diagnosis not present

## 2016-05-05 DIAGNOSIS — T148XXA Other injury of unspecified body region, initial encounter: Secondary | ICD-10-CM | POA: Diagnosis not present

## 2016-05-05 DIAGNOSIS — F333 Major depressive disorder, recurrent, severe with psychotic symptoms: Secondary | ICD-10-CM | POA: Diagnosis not present

## 2016-05-05 DIAGNOSIS — I509 Heart failure, unspecified: Secondary | ICD-10-CM | POA: Diagnosis not present

## 2016-05-05 DIAGNOSIS — R339 Retention of urine, unspecified: Secondary | ICD-10-CM | POA: Diagnosis not present

## 2016-05-05 DIAGNOSIS — I10 Essential (primary) hypertension: Secondary | ICD-10-CM | POA: Diagnosis not present

## 2016-05-10 DIAGNOSIS — Z9981 Dependence on supplemental oxygen: Secondary | ICD-10-CM | POA: Diagnosis not present

## 2016-05-10 DIAGNOSIS — R410 Disorientation, unspecified: Secondary | ICD-10-CM | POA: Diagnosis not present

## 2016-05-10 DIAGNOSIS — R1311 Dysphagia, oral phase: Secondary | ICD-10-CM | POA: Diagnosis not present

## 2016-05-10 DIAGNOSIS — M6281 Muscle weakness (generalized): Secondary | ICD-10-CM | POA: Diagnosis not present

## 2016-05-10 DIAGNOSIS — J9621 Acute and chronic respiratory failure with hypoxia: Secondary | ICD-10-CM | POA: Diagnosis not present

## 2016-05-10 DIAGNOSIS — N390 Urinary tract infection, site not specified: Secondary | ICD-10-CM | POA: Diagnosis not present

## 2016-05-10 DIAGNOSIS — R0902 Hypoxemia: Secondary | ICD-10-CM | POA: Diagnosis not present

## 2016-05-10 DIAGNOSIS — Z87891 Personal history of nicotine dependence: Secondary | ICD-10-CM | POA: Diagnosis not present

## 2016-05-10 DIAGNOSIS — N400 Enlarged prostate without lower urinary tract symptoms: Secondary | ICD-10-CM | POA: Diagnosis not present

## 2016-05-10 DIAGNOSIS — R41841 Cognitive communication deficit: Secondary | ICD-10-CM | POA: Diagnosis not present

## 2016-05-10 DIAGNOSIS — R488 Other symbolic dysfunctions: Secondary | ICD-10-CM | POA: Diagnosis not present

## 2016-05-10 DIAGNOSIS — Z66 Do not resuscitate: Secondary | ICD-10-CM | POA: Diagnosis not present

## 2016-05-10 DIAGNOSIS — N401 Enlarged prostate with lower urinary tract symptoms: Secondary | ICD-10-CM | POA: Diagnosis not present

## 2016-05-10 DIAGNOSIS — R0689 Other abnormalities of breathing: Secondary | ICD-10-CM | POA: Diagnosis not present

## 2016-05-10 DIAGNOSIS — Z7982 Long term (current) use of aspirin: Secondary | ICD-10-CM | POA: Diagnosis not present

## 2016-05-10 DIAGNOSIS — K661 Hemoperitoneum: Secondary | ICD-10-CM | POA: Diagnosis not present

## 2016-05-10 DIAGNOSIS — M7989 Other specified soft tissue disorders: Secondary | ICD-10-CM | POA: Diagnosis not present

## 2016-05-10 DIAGNOSIS — J9811 Atelectasis: Secondary | ICD-10-CM | POA: Diagnosis not present

## 2016-05-10 DIAGNOSIS — Z9289 Personal history of other medical treatment: Secondary | ICD-10-CM | POA: Diagnosis not present

## 2016-05-10 DIAGNOSIS — Z79899 Other long term (current) drug therapy: Secondary | ICD-10-CM | POA: Diagnosis not present

## 2016-05-10 DIAGNOSIS — R4182 Altered mental status, unspecified: Secondary | ICD-10-CM | POA: Diagnosis not present

## 2016-05-10 DIAGNOSIS — Z880 Allergy status to penicillin: Secondary | ICD-10-CM | POA: Diagnosis not present

## 2016-05-10 DIAGNOSIS — R339 Retention of urine, unspecified: Secondary | ICD-10-CM | POA: Diagnosis not present

## 2016-05-10 DIAGNOSIS — I10 Essential (primary) hypertension: Secondary | ICD-10-CM | POA: Diagnosis not present

## 2016-05-10 DIAGNOSIS — J449 Chronic obstructive pulmonary disease, unspecified: Secondary | ICD-10-CM | POA: Diagnosis not present

## 2016-05-10 DIAGNOSIS — J9622 Acute and chronic respiratory failure with hypercapnia: Secondary | ICD-10-CM | POA: Diagnosis not present

## 2016-05-10 DIAGNOSIS — R269 Unspecified abnormalities of gait and mobility: Secondary | ICD-10-CM | POA: Diagnosis not present

## 2016-05-10 DIAGNOSIS — J9611 Chronic respiratory failure with hypoxia: Secondary | ICD-10-CM | POA: Diagnosis not present

## 2016-05-10 DIAGNOSIS — J441 Chronic obstructive pulmonary disease with (acute) exacerbation: Secondary | ICD-10-CM | POA: Diagnosis not present

## 2016-05-10 DIAGNOSIS — T597X1D Toxic effect of carbon dioxide, accidental (unintentional), subsequent encounter: Secondary | ICD-10-CM | POA: Diagnosis not present

## 2016-05-16 ENCOUNTER — Other Ambulatory Visit: Payer: Self-pay | Admitting: Internal Medicine

## 2016-05-17 DIAGNOSIS — N401 Enlarged prostate with lower urinary tract symptoms: Secondary | ICD-10-CM | POA: Diagnosis not present

## 2016-05-17 DIAGNOSIS — R339 Retention of urine, unspecified: Secondary | ICD-10-CM | POA: Diagnosis not present

## 2016-05-17 DIAGNOSIS — R1311 Dysphagia, oral phase: Secondary | ICD-10-CM | POA: Diagnosis not present

## 2016-05-17 DIAGNOSIS — J9611 Chronic respiratory failure with hypoxia: Secondary | ICD-10-CM | POA: Diagnosis not present

## 2016-05-17 DIAGNOSIS — R300 Dysuria: Secondary | ICD-10-CM | POA: Diagnosis not present

## 2016-05-17 DIAGNOSIS — R0689 Other abnormalities of breathing: Secondary | ICD-10-CM | POA: Diagnosis not present

## 2016-05-17 DIAGNOSIS — M6281 Muscle weakness (generalized): Secondary | ICD-10-CM | POA: Diagnosis not present

## 2016-05-17 DIAGNOSIS — I10 Essential (primary) hypertension: Secondary | ICD-10-CM | POA: Diagnosis not present

## 2016-05-17 DIAGNOSIS — R0902 Hypoxemia: Secondary | ICD-10-CM | POA: Diagnosis not present

## 2016-05-17 DIAGNOSIS — Z9981 Dependence on supplemental oxygen: Secondary | ICD-10-CM | POA: Diagnosis not present

## 2016-05-17 DIAGNOSIS — R309 Painful micturition, unspecified: Secondary | ICD-10-CM | POA: Diagnosis not present

## 2016-05-17 DIAGNOSIS — B9562 Methicillin resistant Staphylococcus aureus infection as the cause of diseases classified elsewhere: Secondary | ICD-10-CM | POA: Diagnosis not present

## 2016-05-17 DIAGNOSIS — R4182 Altered mental status, unspecified: Secondary | ICD-10-CM | POA: Diagnosis not present

## 2016-05-17 DIAGNOSIS — J441 Chronic obstructive pulmonary disease with (acute) exacerbation: Secondary | ICD-10-CM | POA: Diagnosis not present

## 2016-05-17 DIAGNOSIS — N390 Urinary tract infection, site not specified: Secondary | ICD-10-CM | POA: Diagnosis not present

## 2016-05-17 DIAGNOSIS — R4781 Slurred speech: Secondary | ICD-10-CM | POA: Diagnosis not present

## 2016-05-17 DIAGNOSIS — R600 Localized edema: Secondary | ICD-10-CM | POA: Diagnosis not present

## 2016-05-17 DIAGNOSIS — R41841 Cognitive communication deficit: Secondary | ICD-10-CM | POA: Diagnosis not present

## 2016-05-17 DIAGNOSIS — T597X1D Toxic effect of carbon dioxide, accidental (unintentional), subsequent encounter: Secondary | ICD-10-CM | POA: Diagnosis not present

## 2016-05-17 DIAGNOSIS — R488 Other symbolic dysfunctions: Secondary | ICD-10-CM | POA: Diagnosis not present

## 2016-05-17 DIAGNOSIS — J449 Chronic obstructive pulmonary disease, unspecified: Secondary | ICD-10-CM | POA: Diagnosis not present

## 2016-05-17 DIAGNOSIS — K661 Hemoperitoneum: Secondary | ICD-10-CM | POA: Diagnosis not present

## 2016-05-17 DIAGNOSIS — A4902 Methicillin resistant Staphylococcus aureus infection, unspecified site: Secondary | ICD-10-CM | POA: Diagnosis not present

## 2016-05-17 DIAGNOSIS — R05 Cough: Secondary | ICD-10-CM | POA: Diagnosis not present

## 2016-05-28 DIAGNOSIS — R300 Dysuria: Secondary | ICD-10-CM | POA: Diagnosis not present

## 2016-05-28 DIAGNOSIS — R309 Painful micturition, unspecified: Secondary | ICD-10-CM | POA: Diagnosis not present

## 2016-05-28 DIAGNOSIS — R339 Retention of urine, unspecified: Secondary | ICD-10-CM | POA: Diagnosis not present

## 2016-05-31 DIAGNOSIS — N390 Urinary tract infection, site not specified: Secondary | ICD-10-CM | POA: Diagnosis not present

## 2016-05-31 DIAGNOSIS — B9562 Methicillin resistant Staphylococcus aureus infection as the cause of diseases classified elsewhere: Secondary | ICD-10-CM | POA: Diagnosis not present

## 2016-06-01 DIAGNOSIS — J449 Chronic obstructive pulmonary disease, unspecified: Secondary | ICD-10-CM | POA: Diagnosis not present

## 2016-06-01 DIAGNOSIS — B9562 Methicillin resistant Staphylococcus aureus infection as the cause of diseases classified elsewhere: Secondary | ICD-10-CM | POA: Diagnosis not present

## 2016-06-01 DIAGNOSIS — N390 Urinary tract infection, site not specified: Secondary | ICD-10-CM | POA: Diagnosis not present

## 2016-06-02 DIAGNOSIS — R0902 Hypoxemia: Secondary | ICD-10-CM | POA: Diagnosis not present

## 2016-06-02 DIAGNOSIS — I10 Essential (primary) hypertension: Secondary | ICD-10-CM | POA: Diagnosis not present

## 2016-06-02 DIAGNOSIS — J449 Chronic obstructive pulmonary disease, unspecified: Secondary | ICD-10-CM | POA: Diagnosis not present

## 2016-06-02 DIAGNOSIS — R4182 Altered mental status, unspecified: Secondary | ICD-10-CM | POA: Diagnosis not present

## 2016-06-02 DIAGNOSIS — R600 Localized edema: Secondary | ICD-10-CM | POA: Diagnosis not present

## 2016-06-02 DIAGNOSIS — A4902 Methicillin resistant Staphylococcus aureus infection, unspecified site: Secondary | ICD-10-CM | POA: Diagnosis not present

## 2016-06-02 DIAGNOSIS — R4781 Slurred speech: Secondary | ICD-10-CM | POA: Diagnosis not present

## 2016-06-04 ENCOUNTER — Other Ambulatory Visit: Payer: Self-pay | Admitting: *Deleted

## 2016-06-04 NOTE — Patient Outreach (Signed)
[facility]) Care Management  06/04/2016  Wayne Nash 20-Jan-1934 [id number]  Transition of Care Referral  Referral Date: 06/04/16 Referral Source: Status post hospital discharge, transition of care Date of Discharge: 06-26-2016 Facility: [facility] Discharge Diagnosis: N/A Insurance: [facility]  Outreach attempt # 1, unable to reach, left HIPAA compliant voice message left with call back number.   Plan: RNCM will attempt 2nd telephone outreach in 3 business days.  Lake Bells, RN, BSN, MHA/MSL, [place] Telephonic Care Manager Coordinator [facility] Direct Phone: [phone] Toll Free: [phone] Fax: [phone]

## 2016-06-07 ENCOUNTER — Other Ambulatory Visit: Payer: Self-pay | Admitting: *Deleted

## 2016-06-07 NOTE — Patient Outreach (Signed)
[facility]) Care Management  06/07/2016  ELISON WORREL 06/21/1933 [id number]   Transition of Care Referral  Referral Date: 06/04/16 Referral Source: [facility] List Date of Discharge: Jul 03, 2016 Facility: [facility] Discharge Diagnosis: SNF Insurance: [facility]  Outreach attempt # 2, left HIPPA compliant voicemail.      Plan: RNCM will attempt to call patient within 3 business days.  Lake Bells, RN, BSN, MHA/MSL, [place] Telephonic Care Manager Coordinator [facility] Direct Phone: [phone] Toll Free: [phone] Fax: [phone]

## 2016-06-09 ENCOUNTER — Encounter: Payer: Self-pay | Admitting: *Deleted

## 2016-06-10 NOTE — Telephone Encounter (Signed)
This encounter was created in error - please disregard.

## 2016-06-11 ENCOUNTER — Other Ambulatory Visit: Payer: Self-pay | Admitting: *Deleted

## 2016-06-11 ENCOUNTER — Telehealth: Payer: Self-pay

## 2016-06-11 NOTE — Patient Outreach (Signed)
[facility]) Care Management  06/11/2016  REYHAN MORONTA Aug 19, 1933 [id number]   Transition of Care Referral  Referral Date: 06/04/16 Referral Source: Status post hospital discharge, transition of care Date of Discharge: 11-Jun-2016 Facility: [facility] Discharge Diagnosis: N/A Insurance: [employer]  Per MD records, patient passed away on 11-Jun-2016. Case is being closed at this time.   Plan:   RNCM will notify the administrative assistant of case status.   Lake Bells, RN, BSN, MHA/MSL, [place] Telephonic Care Manager Coordinator [facility] Direct Phone: [phone] Toll Free: [phone] Fax: [phone]

## 2016-06-11 NOTE — Telephone Encounter (Signed)
Per [employer], Pt passed away 2016/06/19. Informed Debby Freiberg for status update.

## 2016-06-11 NOTE — Telephone Encounter (Signed)
Thank you for letting me know

## 2016-06-22 DEATH — deceased

## 2016-07-15 ENCOUNTER — Other Ambulatory Visit: Payer: Self-pay | Admitting: Internal Medicine

## 2016-07-22 ENCOUNTER — Ambulatory Visit: Payer: Medicare HMO | Admitting: Internal Medicine

## 2017-02-12 IMAGING — CT CT ANGIO CHEST
2 of 7 series · 18 of 36 positions shown · IV contrast (APPLIED)
Comparison: None.

CLINICAL DATA: dizziness onset 0900 this am when he got up from
bed, hypoxia, HTN, hyperlipidemia, appy

EXAM:
CT ANGIOGRAPHY CHEST WITH CONTRAST
TECHNIQUE: Multidetector CT imaging of the chest was performed using the
standard protocol during bolus administration of intravenous
contrast. Multiplanar CT image reconstructions and MIPs were
obtained to evaluate the vascular anatomy.
CONTRAST:  100mL OMNIPAQUE IOHEXOL 350 MG/ML SOLN

[Series 7: pe 0.75 b26f · axial · 0.66mm/px · z∈[-328,-69]mm · 17 of 964 slices shown]
[im 51/964  lung]
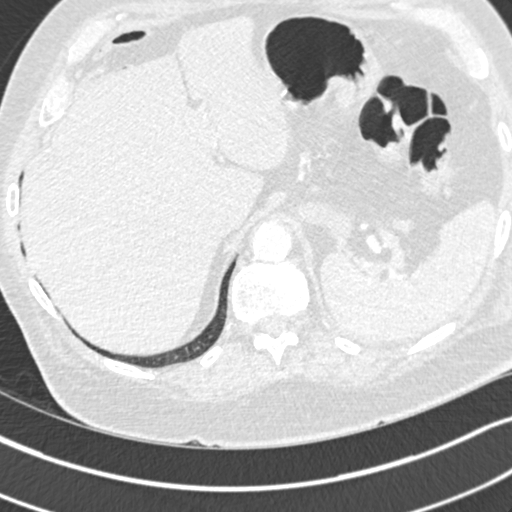
[im 102/964  mediastinal]
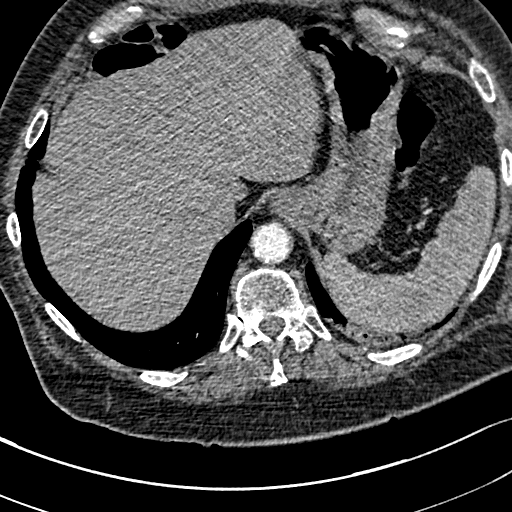
[im 153/964  lung]
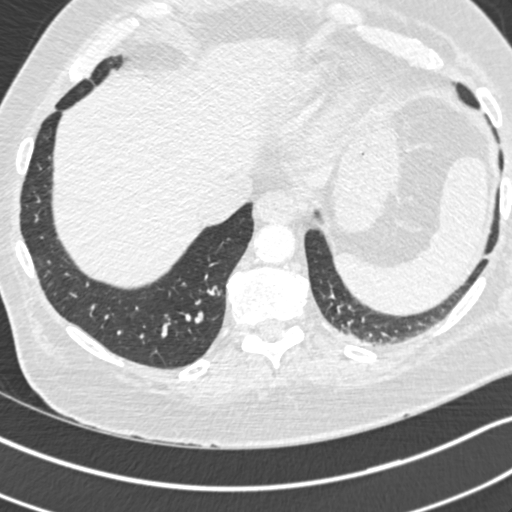
[im 203/964  mediastinal]
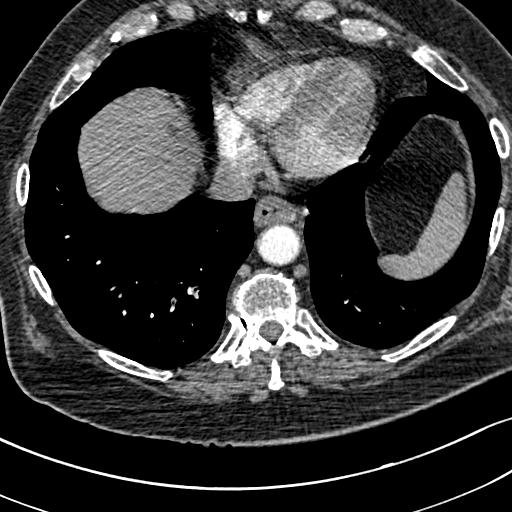
[im 254/964  lung]
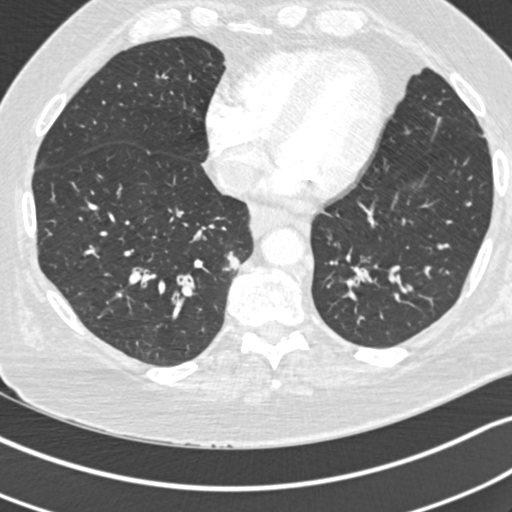
[im 305/964  mediastinal]
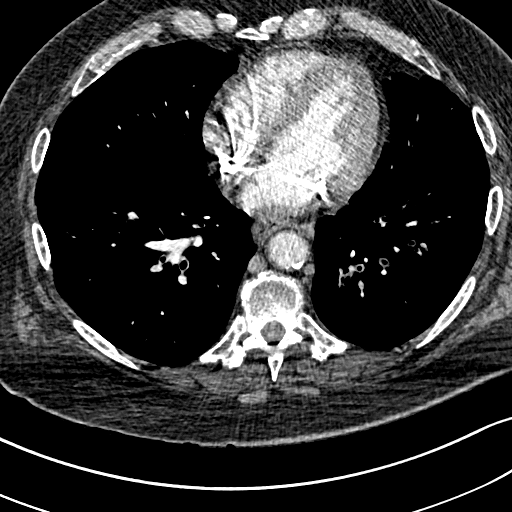
[im 355/964  lung]
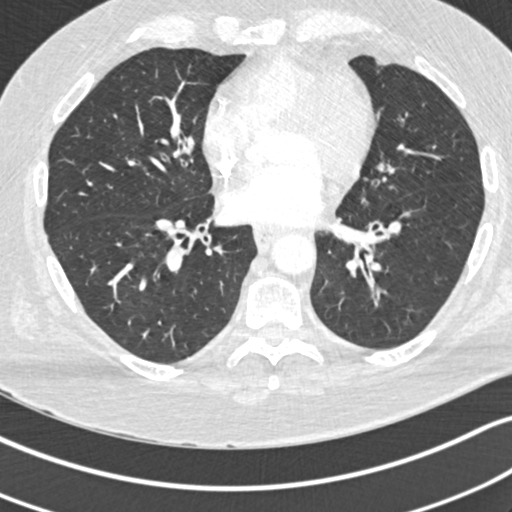
[im 406/964  mediastinal]
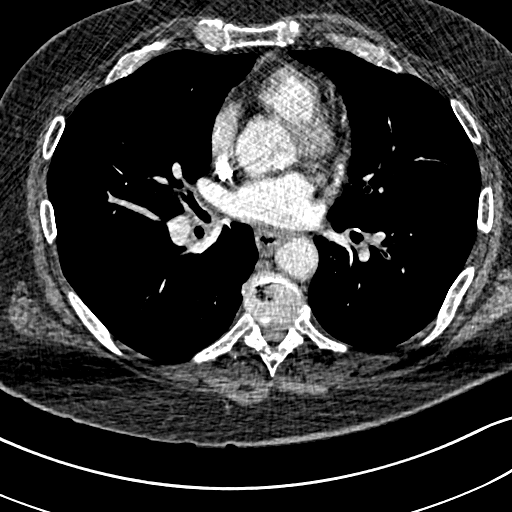
[im 507/964  lung]
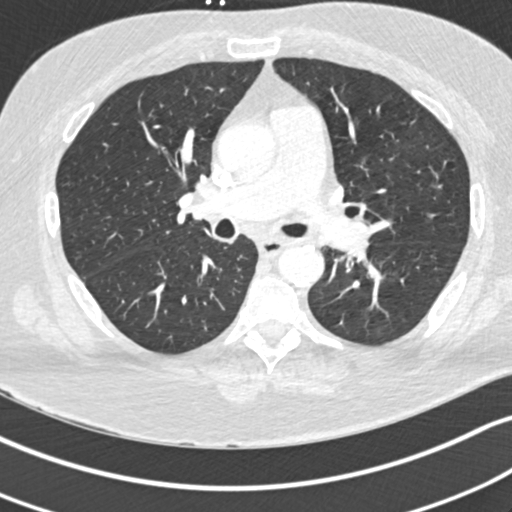
[im 558/964  mediastinal]
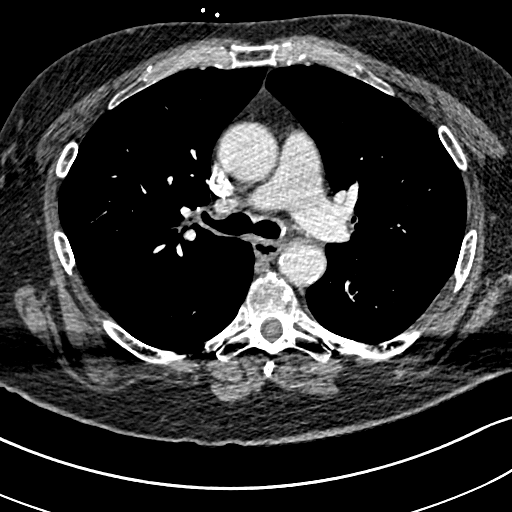
[im 609/964  lung]
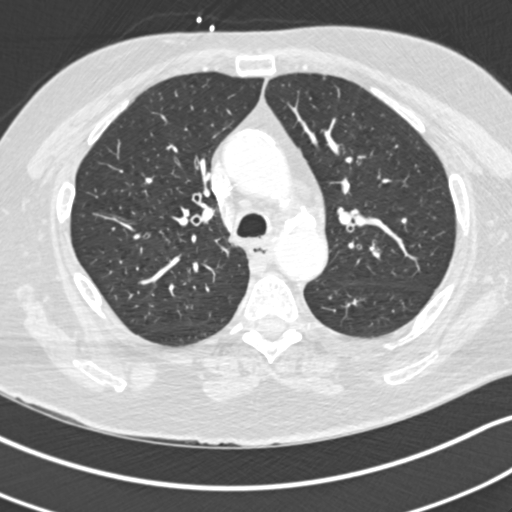
[im 659/964  mediastinal]
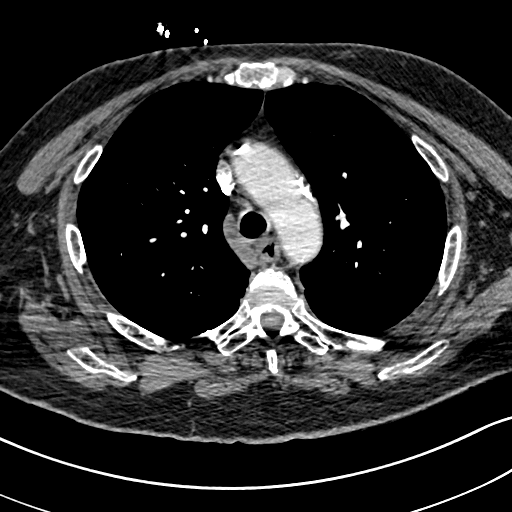
[im 710/964  lung]
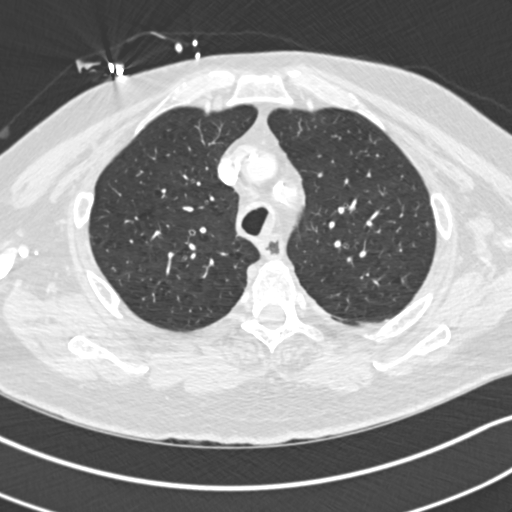
[im 761/964  mediastinal]
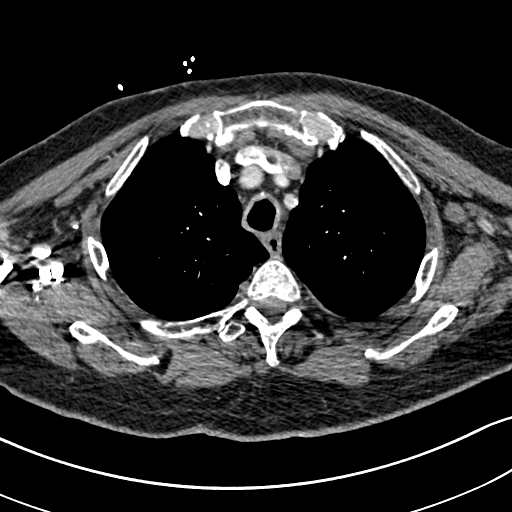
[im 811/964  lung]
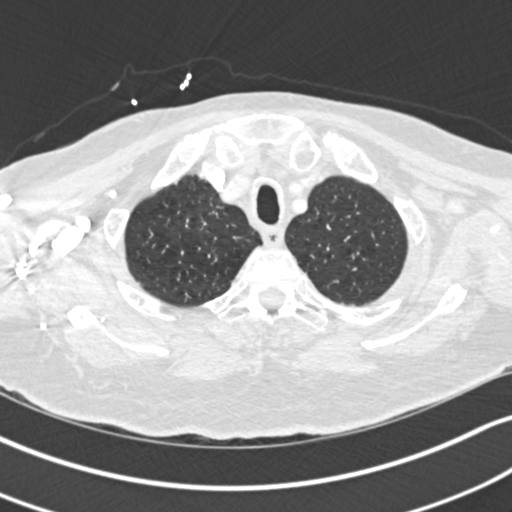
[im 862/964  mediastinal]
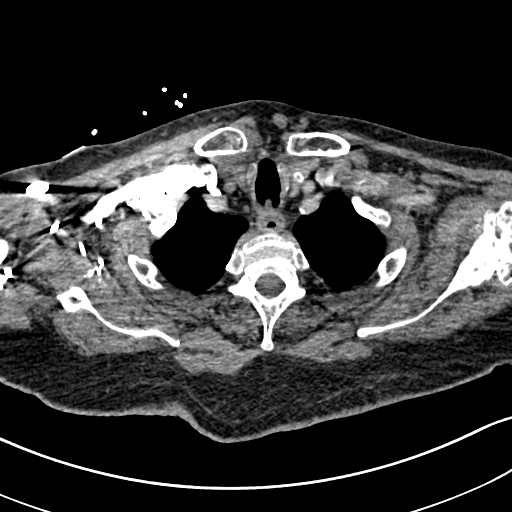
[im 913/964  lung]
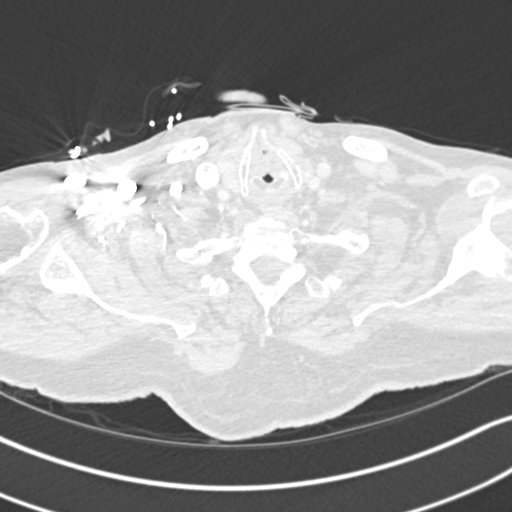

[Series 8: pe 2.0 coronal · coronal · 0.64mm/px · 1 of 147 slices shown]
[im 74/147  mediastinal]
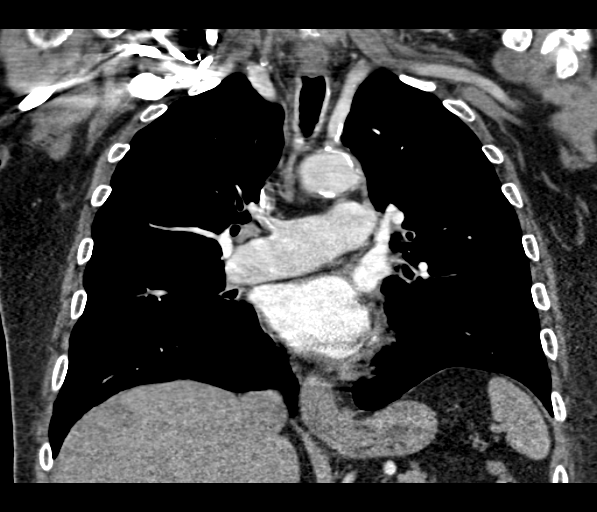

[18 of 36 positions shown; findings below may reference images not displayed]

FINDINGS: Right arm contrast injection. The SVC is patent. RV/LV ratio less
than 1, normal. Dilated central pulmonary arteries. Satisfactory
opacification of pulmonary arteries noted, and there is no evidence
of pulmonary emboli. Patent superior and inferior pulmonary veins
bilaterally. Mitral annulus calcifications. Moderate coronary
calcifications. Adequate contrast opacification of the thoracic
aorta with no evidence of dissection, aneurysm, or stenosis. There
is bovine variant brachiocephalic arch anatomy without proximal
stenosis. Coarse calcifications and mild atheromatous irregularity.

No pleural or pericardial effusion. Subcentimeter prevascular, AP
window, and pretracheal lymph nodes. No hilar adenopathy evident.

Extensive peribronchial thickening. Emphysematous changes most
marked in the lung apices. Minimal dependent atelectasis posteriorly
at the left lung base. 11 mm subpleural nodule in the medial basal
segment right lower lobe image 95/8

Early spurring in the lower thoracic spine. Sternum intact.
Visualized portions of upper abdomen unremarkable.

Review of the MIP images confirms the above findings.
IMPRESSION: 1. Negative for acute PE or thoracic aortic dissection.
2. Atherosclerosis, including aortic and coronary artery disease.
Please note that although the presence of coronary artery calcium
documents the presence of coronary artery disease, the severity of
this disease and any potential stenosis cannot be assessed on this
non-gated CT examination. Assessment for potential risk factor
modification, dietary therapy or pharmacologic therapy may be
warranted, if clinically indicated.
3. 11 mm right lower lobe pulmonary nodule. Recommend follow-up CT
in 3 months or PET/CT for further evaluation. This recommendation
follows the consensus statement: "Guidelines for Management of Small
Pulmonary Nodules Detected on CT Scans: A Statement from the
